# Patient Record
Sex: Female | Born: 1945 | Race: White | Hispanic: No | Marital: Married | Smoking: Never smoker
Health system: Southern US, Community
[De-identification: ages and names within clinical notes are randomized; demographics above are authoritative.]

## PROBLEM LIST (undated history)

## (undated) DIAGNOSIS — F329 Major depressive disorder, single episode, unspecified: Secondary | ICD-10-CM

## (undated) DIAGNOSIS — H409 Unspecified glaucoma: Secondary | ICD-10-CM

## (undated) DIAGNOSIS — E559 Vitamin D deficiency, unspecified: Secondary | ICD-10-CM

## (undated) DIAGNOSIS — I428 Other cardiomyopathies: Secondary | ICD-10-CM

## (undated) DIAGNOSIS — M1990 Unspecified osteoarthritis, unspecified site: Secondary | ICD-10-CM

## (undated) DIAGNOSIS — G2581 Restless legs syndrome: Secondary | ICD-10-CM

## (undated) DIAGNOSIS — Z8679 Personal history of other diseases of the circulatory system: Secondary | ICD-10-CM

## (undated) DIAGNOSIS — Z8719 Personal history of other diseases of the digestive system: Secondary | ICD-10-CM

## (undated) DIAGNOSIS — E669 Obesity, unspecified: Secondary | ICD-10-CM

## (undated) DIAGNOSIS — F32A Depression, unspecified: Secondary | ICD-10-CM

## (undated) DIAGNOSIS — K219 Gastro-esophageal reflux disease without esophagitis: Secondary | ICD-10-CM

## (undated) DIAGNOSIS — Z5189 Encounter for other specified aftercare: Secondary | ICD-10-CM

## (undated) DIAGNOSIS — I509 Heart failure, unspecified: Secondary | ICD-10-CM

## (undated) DIAGNOSIS — I10 Essential (primary) hypertension: Secondary | ICD-10-CM

## (undated) DIAGNOSIS — F419 Anxiety disorder, unspecified: Secondary | ICD-10-CM

## (undated) HISTORY — DX: Unspecified osteoarthritis, unspecified site: M19.90

## (undated) HISTORY — PX: FOOT SURGERY: SHX648

## (undated) HISTORY — DX: Essential (primary) hypertension: I10

## (undated) HISTORY — PX: JOINT REPLACEMENT: SHX530

## (undated) HISTORY — DX: Obesity, unspecified: E66.9

## (undated) HISTORY — PX: TOE SURGERY: SHX1073

## (undated) HISTORY — DX: Heart failure, unspecified: I50.9

## (undated) HISTORY — DX: Encounter for other specified aftercare: Z51.89

---

## 1978-09-25 HISTORY — PX: BLADDER SUSPENSION: SHX72

## 1988-09-25 HISTORY — PX: ABDOMINAL HYSTERECTOMY: SHX81

## 2010-10-26 HISTORY — PX: CARDIAC CATHETERIZATION: SHX172

## 2010-10-26 HISTORY — PX: OTHER SURGICAL HISTORY: SHX169

## 2013-01-22 ENCOUNTER — Ambulatory Visit (INDEPENDENT_AMBULATORY_CARE_PROVIDER_SITE_OTHER): Payer: Medicare Other | Admitting: Surgery

## 2013-01-23 ENCOUNTER — Encounter (INDEPENDENT_AMBULATORY_CARE_PROVIDER_SITE_OTHER): Payer: Self-pay

## 2013-01-23 ENCOUNTER — Ambulatory Visit (INDEPENDENT_AMBULATORY_CARE_PROVIDER_SITE_OTHER): Payer: Medicare Other | Admitting: General Surgery

## 2013-01-23 ENCOUNTER — Encounter (INDEPENDENT_AMBULATORY_CARE_PROVIDER_SITE_OTHER): Payer: Self-pay | Admitting: General Surgery

## 2013-01-23 DIAGNOSIS — M129 Arthropathy, unspecified: Secondary | ICD-10-CM

## 2013-01-23 DIAGNOSIS — M1990 Unspecified osteoarthritis, unspecified site: Secondary | ICD-10-CM

## 2013-01-23 DIAGNOSIS — F32A Depression, unspecified: Secondary | ICD-10-CM

## 2013-01-23 DIAGNOSIS — I509 Heart failure, unspecified: Secondary | ICD-10-CM

## 2013-01-23 DIAGNOSIS — F329 Major depressive disorder, single episode, unspecified: Secondary | ICD-10-CM

## 2013-01-23 DIAGNOSIS — Z6841 Body Mass Index (BMI) 40.0 and over, adult: Secondary | ICD-10-CM

## 2013-01-23 DIAGNOSIS — I10 Essential (primary) hypertension: Secondary | ICD-10-CM

## 2013-01-23 NOTE — Progress Notes (Signed)
Patient ID: Marie Martinez, female   DOB: 12-21-1945, 67 y.o.   MRN: [id number]  Chief Complaint  Patient presents with  . Bariatric Pre-op    initial    HPI Marie Martinez is a 67 y.o. female.  This patient presents for her initial weight loss surgery evaluation. She has a BMI of 47.6 with comorbidities of depression, arthritis, hypertension, and congestive heart failure.  She has a tender information session and is most interested in a vertical sleeve gastrectomy. She has struggled with her weight since delayed-release attributes this to some of the medication that she was placed on for treatment of arthritis and extremity pains. Since then she is off her chronic pain medication but still has issues with severe arthritis and is limited in her activity. She says that she can walk about 2 city blocks before she will get short of breath. About 3 years ago she was admitted to the hospital for treatment of CHF and her doctor is Dr. Karren Burly. She is also followed by a psychologist, Dr Milly Jakob.  She has tried several diets including Weight Watchers and Nutrisystem in the Nutrisystem seem to work the best where she lost about 40 pounds in 6 months but regained the weight. She denies any history of heartburn or reflux. She denies any active chest pain and says that she had a normal cardiac catheter about 3 years ago. HPI  Past Medical History  Diagnosis Date  . Arthritis   . Blood transfusion without reported diagnosis   . CHF (congestive heart failure)   . Hypertension     Past Surgical History  Procedure Laterality Date  . Abdominal hysterectomy  1990    bladder tack  . Foot surgery Bilateral late 80's  . Heart ablation  2012    Family History  Problem Relation Age of Onset  . Cancer Mother     breast  . Dementia Mother   . Heart disease Father   . Cancer Maternal Aunt     breast  . Cancer Cousin     breast    Social History History  Substance Use Topics  . Smoking status:  Never Smoker   . Smokeless tobacco: Not on file  . Alcohol Use: Yes     Comment: wine socially    Allergies  Allergen Reactions  . Eggs Or Egg-Derived Products Nausea And Vomiting  . Sulfur Nausea Only    Current Outpatient Prescriptions  Medication Sig Dispense Refill  . busPIRone (BUSPAR) 15 MG tablet       . carvedilol (COREG) 12.5 MG tablet       . CELEBREX 200 MG capsule       . DULoxetine (CYMBALTA) 60 MG capsule       . furosemide (LASIX) 20 MG tablet Take 20 mg by mouth as needed.      . gabapentin (NEURONTIN) 600 MG tablet       . HYDROcodone-acetaminophen (NORCO) 7.5-325 MG per tablet       . hydroxychloroquine (PLAQUENIL) 200 MG tablet       . latanoprost (XALATAN) 0.005 % ophthalmic solution       . lisinopril (PRINIVIL,ZESTRIL) 10 MG tablet       . nystatin-triamcinolone ointment (MYCOLOG)       . Oxcarbazepine (TRILEPTAL) 300 MG tablet       . rOPINIRole (REQUIP) 2 MG tablet       . Urea 40 % LOTN        No current  facility-administered medications for this visit.    Review of Systems Review of Systems All other review of systems negative or noncontributory except as stated in the HPI  Blood pressure 102/60, pulse 80, temperature 97.4 F (36.3 C), temperature source Temporal, resp. rate 18, height 5' 5.25" (1.657 m), weight 288 lb 9.6 oz (130.908 kg), SpO2 98.00%.  Physical Exam Physical Exam Physical Exam  Nursing note and vitals reviewed. Constitutional: She is oriented to person, place, and time. She appears well-developed and well-nourished. No distress.  HENT:  Head: Normocephalic and atraumatic.  Mouth/Throat: No oropharyngeal exudate.  Eyes: Conjunctivae and EOM are normal. Pupils are equal, round, and reactive to light. Right eye exhibits no discharge. Left eye exhibits no discharge. No scleral icterus.  Neck: Normal range of motion. Neck supple. No tracheal deviation present.  Cardiovascular: Normal rate, regular rhythm, normal heart sounds and  intact distal pulses.   Pulmonary/Chest: Effort normal and breath sounds normal. No stridor. No respiratory distress. She has no wheezes.  Abdominal: Soft. Bowel sounds are normal. She exhibits no distension and no mass. There is no tenderness. There is no rebound and no guarding.  Musculoskeletal: Normal range of motion. She exhibits no edema and no tenderness.  Neurological: She is alert and oriented to person, place, and time.  Skin: Skin is warm and dry. No rash noted. She is not diaphoretic. No erythema. No pallor.  Psychiatric: She has a normal mood and affect. Her behavior is normal. Judgment and thought content normal.     Data Reviewed   Assessment    Morbid obesity with a BMI of 47.7 and comorbidities of depression, arthritis, hypertension, and congestive heart failure With a long discussion about the medical and surgical options for weight loss and we discussed the pros and cons of each procedure. I think that she would qualify of 4 any of the surgeries that we offered, however, my main concern is not really what procedure to choose for but whether we should operate at all for weight loss. Though I think that she would certainly benefit from the weight loss, I'm not sure that we were really extend her life at this age. She would certainly be higher risk for cardiac complications given her history of CHF. I would certainly want her cardiologist on board with recommendations. I think the main benefit for surgery for her would be quality-of-life if we can achieve the weight loss that she desires. We did discuss the risks and benefits of each procedure including the risks of sleeve gastrectomy.  The risks of infection, bleeding, pain, scarring, weight regain, too little or too much weight loss, vitamin deficiencies and need for lifelong vitamin supplementation, hair loss, need for protein supplementation, leaks, stricture, reflux, food intolerance, need for reoperation and conversion to roux Y  gastric bypass, need for open surgery, injury to spleen or surrounding structures, DVT's, PE, and death again discussed with the patient and the patient expressed understanding and desires to proceed with laparoscopic vertical sleeve gastrectomy, possible open, intraoperative endoscopy. She would like to proceed with a sleeve gastrectomy. I recommended that she discussed this with her cardiologist and think about whether she really wants to undergo the risk of surgery and whether she thinks that it really would improve her quality of life enough to make it worthwhile for her.     Plan    We will go ahead and request records and clearance from her cardiologist and check some laboratory studies and preoperative nutrition and Psychology evaluations,  and set her up for upper GI. We will go ahead and see her back after this to discuss the results and whether she wants to continue with sleeve gastrectomy        Bradd Merlos DAVID 01/23/2013, 5:15 PM

## 2013-01-24 ENCOUNTER — Telehealth (INDEPENDENT_AMBULATORY_CARE_PROVIDER_SITE_OTHER): Payer: Self-pay

## 2013-01-24 NOTE — Telephone Encounter (Signed)
Pre-Op Cardiac Clearance faxed to Dr. Conrad Labette @ [facility] [phone]. Fax confirmation rec'd and attached to clearance

## 2013-02-03 ENCOUNTER — Ambulatory Visit (HOSPITAL_COMMUNITY)
Admission: RE | Admit: 2013-02-03 | Discharge: 2013-02-03 | Disposition: A | Discharge status: Home / Self Care | Payer: Medicare Other | Source: Ambulatory Visit | Attending: General Surgery | Admitting: General Surgery

## 2013-02-03 DIAGNOSIS — M129 Arthropathy, unspecified: Secondary | ICD-10-CM | POA: Insufficient documentation

## 2013-02-03 DIAGNOSIS — I509 Heart failure, unspecified: Secondary | ICD-10-CM

## 2013-02-03 DIAGNOSIS — I10 Essential (primary) hypertension: Secondary | ICD-10-CM

## 2013-02-03 DIAGNOSIS — F329 Major depressive disorder, single episode, unspecified: Secondary | ICD-10-CM | POA: Insufficient documentation

## 2013-02-03 DIAGNOSIS — F3289 Other specified depressive episodes: Secondary | ICD-10-CM | POA: Insufficient documentation

## 2013-02-03 DIAGNOSIS — Z6841 Body Mass Index (BMI) 40.0 and over, adult: Secondary | ICD-10-CM | POA: Insufficient documentation

## 2013-02-03 DIAGNOSIS — K449 Diaphragmatic hernia without obstruction or gangrene: Secondary | ICD-10-CM | POA: Insufficient documentation

## 2013-02-03 DIAGNOSIS — M1990 Unspecified osteoarthritis, unspecified site: Secondary | ICD-10-CM

## 2013-02-03 DIAGNOSIS — F32A Depression, unspecified: Secondary | ICD-10-CM

## 2013-02-03 LAB — COMPREHENSIVE METABOLIC PANEL
ALT: 12 U/L (ref 0–35)
Albumin: 4 g/dL (ref 3.5–5.2)
Alkaline Phosphatase: 76 U/L (ref 39–117)
CO2: 27 mEq/L (ref 19–32)
Glucose, Bld: 86 mg/dL (ref 70–99)
Potassium: 4.8 mEq/L (ref 3.5–5.3)
Sodium: 135 mEq/L (ref 135–145)
Total Protein: 6.7 g/dL (ref 6.0–8.3)

## 2013-02-03 LAB — CBC WITH DIFFERENTIAL/PLATELET
Basophils Relative: 1 % (ref 0–1)
Hemoglobin: 12.1 g/dL (ref 12.0–15.0)
Lymphs Abs: 1.2 10*3/uL (ref 0.7–4.0)
Monocytes Relative: 12 % (ref 3–12)
Neutro Abs: 2.8 10*3/uL (ref 1.7–7.7)
Neutrophils Relative %: 60 % (ref 43–77)
RBC: 4.34 MIL/uL (ref 3.87–5.11)

## 2013-02-03 LAB — LIPID PANEL
HDL: 54 mg/dL (ref >39)
LDL Cholesterol: 111 mg/dL — ABNORMAL HIGH (ref 0–99)
VLDL: 18 mg/dL (ref 0–40)

## 2013-02-03 LAB — T4: T4, Total: 6.8 ug/dL (ref 5.0–12.5)

## 2013-02-04 ENCOUNTER — Ambulatory Visit (INDEPENDENT_AMBULATORY_CARE_PROVIDER_SITE_OTHER): Payer: 59 | Admitting: Psychiatry

## 2013-02-11 ENCOUNTER — Ambulatory Visit: Payer: 59 | Admitting: Psychiatry

## 2013-02-12 ENCOUNTER — Ambulatory Visit: Payer: Self-pay | Admitting: *Deleted

## 2013-02-20 ENCOUNTER — Ambulatory Visit: Payer: Self-pay | Admitting: *Deleted

## 2013-05-14 ENCOUNTER — Ambulatory Visit: Payer: 59 | Admitting: Psychiatry

## 2013-05-27 ENCOUNTER — Ambulatory Visit (INDEPENDENT_AMBULATORY_CARE_PROVIDER_SITE_OTHER): Payer: 59 | Admitting: Psychiatry

## 2013-06-24 ENCOUNTER — Encounter (INDEPENDENT_AMBULATORY_CARE_PROVIDER_SITE_OTHER): Payer: Self-pay

## 2013-10-15 ENCOUNTER — Telehealth (INDEPENDENT_AMBULATORY_CARE_PROVIDER_SITE_OTHER): Payer: Self-pay | Admitting: Surgery

## 2013-10-15 ENCOUNTER — Encounter (INDEPENDENT_AMBULATORY_CARE_PROVIDER_SITE_OTHER): Payer: Self-pay

## 2013-10-15 ENCOUNTER — Encounter (INDEPENDENT_AMBULATORY_CARE_PROVIDER_SITE_OTHER): Payer: Self-pay | Admitting: Surgery

## 2013-10-15 ENCOUNTER — Ambulatory Visit (INDEPENDENT_AMBULATORY_CARE_PROVIDER_SITE_OTHER): Payer: Medicare PPO | Admitting: Surgery

## 2013-10-15 DIAGNOSIS — Z6841 Body Mass Index (BMI) 40.0 and over, adult: Secondary | ICD-10-CM

## 2013-10-15 DIAGNOSIS — I10 Essential (primary) hypertension: Secondary | ICD-10-CM

## 2013-10-15 NOTE — Patient Instructions (Signed)

## 2013-10-15 NOTE — Progress Notes (Signed)
Patient ID: Marie Martinez, female   DOB: 1945-12-03, 68 y.o.   MRN: [id number]  Chief Complaint  Patient presents with  . Obesity    transfer of care from Dr. Bjorn Pippin    HPI Marie Martinez is a 68 y.o. female.  This patient presents for her initial weight loss surgery evaluation. She has a BMI of 47.6 with comorbidities of depression, arthritis, hypertension, and congestive heart failure.  She has attended an  information session and is most interested in a vertical sleeve gastrectomy.  She has done the 6 months supervised weight loss.  She has struggled with her weight since delayed-release attributes this to some of the medication that she was placed on for treatment of arthritis and extremity pains. Since then she is off her chronic pain medication but still has issues with severe arthritis and is limited in her activity. She says that she can walk about 2 city blocks before she will get short of breath. About 3 years ago she was admitted to the hospital for treatment of CHF and her doctor is Dr. Karren Burly. She is also followed by a psychologist, Dr Milly Jakob.  She has tried several diets including Weight Watchers and Nutrisystem in the Nutrisystem seem to work the best where she lost about 40 pounds in 6 months but regained the weight. She denies any history of heartburn or reflux. She denies any active chest pain and says that she had a normal cardiac catheter about 3 years ago.  She is now eager to schedule a sleeve gastrectomy HPI  Past Medical History  Diagnosis Date  . Arthritis   . Blood transfusion without reported diagnosis   . CHF (congestive heart failure)   . Hypertension   . Osteoarthritis     Past Surgical History  Procedure Laterality Date  . Abdominal hysterectomy  1990    bladder tack  . Foot surgery Bilateral late 80's  . Heart ablation  2012  . Bladder suspension    . Toe surgery      Family History  Problem Relation Age of Onset  . Cancer Mother     breast  .  Dementia Mother   . Heart disease Father   . Cancer Maternal Aunt     breast  . Cancer Cousin     breast    Social History History  Substance Use Topics  . Smoking status: Never Smoker   . Smokeless tobacco: Not on file  . Alcohol Use: Yes     Comment: wine socially    Allergies  Allergen Reactions  . Eggs Or Egg-Derived Products Nausea And Vomiting  . Sulfur Nausea Only    Current Outpatient Prescriptions  Medication Sig Dispense Refill  . busPIRone (BUSPAR) 15 MG tablet       . carvedilol (COREG) 12.5 MG tablet       . CELEBREX 200 MG capsule       . DULoxetine (CYMBALTA) 60 MG capsule       . furosemide (LASIX) 20 MG tablet Take 20 mg by mouth as needed.      . gabapentin (NEURONTIN) 600 MG tablet       . HYDROcodone-acetaminophen (NORCO) 7.5-325 MG per tablet       . hydroxychloroquine (PLAQUENIL) 200 MG tablet       . hydrOXYzine (ATARAX/VISTARIL) 25 MG tablet       . latanoprost (XALATAN) 0.005 % ophthalmic solution       . levofloxacin (LEVAQUIN) 500 MG tablet       .  lisinopril (PRINIVIL,ZESTRIL) 10 MG tablet       . nystatin-triamcinolone ointment (MYCOLOG)       . Oxcarbazepine (TRILEPTAL) 300 MG tablet       . oxycodone (OXY-IR) 5 MG capsule Take 5 mg by mouth every 4 (four) hours as needed.      Marland Kitchen. rOPINIRole (REQUIP) 2 MG tablet       . Urea 40 % LOTN        No current facility-administered medications for this visit.    Review of Systems Review of Systems All other review of systems negative or noncontributory except as stated in the HPI  Blood pressure 146/86, pulse 72, temperature 98.2 F (36.8 C), temperature source Temporal, resp. rate 24, height 5\' 6"  (1.676 m), weight 292 lb (132.45 kg).  Physical Exam Physical Exam Physical Exam  Nursing note and vitals reviewed. Constitutional: She is oriented to person, place, and time. She appears well-developed and well-nourished. No distress.  HENT:  Head: Normocephalic and atraumatic.   Mouth/Throat: No oropharyngeal exudate.  Eyes: Conjunctivae and EOM are normal. Pupils are equal, round, and reactive to light. Right eye exhibits no discharge. Left eye exhibits no discharge. No scleral icterus.  Neck: Normal range of motion. Neck supple. No tracheal deviation present.  Cardiovascular: Normal rate, regular rhythm, normal heart sounds and intact distal pulses.   Pulmonary/Chest: Effort normal and breath sounds normal. No stridor. No respiratory distress. She has no wheezes.  Abdominal: Soft. Bowel sounds are normal. She exhibits no distension and no mass. There is no tenderness. There is no rebound and no guarding.  Musculoskeletal: Normal range of motion. She exhibits no edema and no tenderness.  Neurological: She is alert and oriented to person, place, and time.  Skin: Skin is warm and dry. No rash noted. She is not diaphoretic. No erythema. No pallor.  Psychiatric: She has a normal mood and affect. Her behavior is normal. Judgment and thought content normal.     Data Reviewed   Assessment    Morbid obesity with a BMI of 47.7 and comorbidities of depression, arthritis, hypertension, and congestive heart failure she has had thorough discussions of the risks of surgery and the benefits. Her main issue has been her arthritis and joint pain. She wants to go ahead and proceed with sleeve gastrectomy. She has the approval of her cardiologist. she has an appointment to visit with the dietitians in a few weeks.  Plan    Laparoscopic sleeve gastrectomy   Ashliegh Parekh B 10/15/2013, 12:36 PM

## 2013-11-01 ENCOUNTER — Encounter: Payer: Medicare HMO | Attending: Unknown Physician Specialty | Admitting: Dietician

## 2013-11-01 DIAGNOSIS — Z713 Dietary counseling and surveillance: Secondary | ICD-10-CM | POA: Insufficient documentation

## 2013-11-01 DIAGNOSIS — Z01818 Encounter for other preprocedural examination: Secondary | ICD-10-CM | POA: Insufficient documentation

## 2013-11-05 ENCOUNTER — Encounter: Payer: Medicare HMO | Admitting: Dietician

## 2013-11-05 ENCOUNTER — Encounter: Payer: Self-pay | Admitting: Dietician

## 2013-11-05 NOTE — Patient Instructions (Signed)
Patient to contact Skip EstimableLaurie Deaton.

## 2013-11-05 NOTE — Progress Notes (Signed)
Pre-Operative Nutrition Education:  Appt start time: 0300   End time:  0400.  Patient was seen on 11/05/2013 for Pre-Operative Bariatric Surgery Education at [facility]. This patient was unable to enroll in pre-op class and received the nutrition portion of the pre-op class with me today. She was not aware of appropriate protein shakes and was unaware that she is required to take supplements. I encouraged her to contact me with any follow-up questions and recommended that she contact Vilinda Flake for the remainder of the pre-op class information.  Surgery date: 11/17/2013 Surgery type: Gastric sleeve Start weight at [facility]: 290.5 lbs Weight today: 290.5 lbs  TANITA  BODY COMP RESULTS  11/05/2013   BMI (kg/m^2) 46.9   Fat Mass (lbs) 146.5   Fat Free Mass (lbs) 144   Total Body Water (lbs) 105.5    The following the learning objectives were met by the patient during this course:  Identify Pre-Op Dietary Goals and will begin 2 weeks pre-operatively  Identify appropriate sources of fluids and proteins   State protein recommendations and appropriate sources pre and post-operatively  Identify Post-Operative Dietary Goals and will follow for 2 weeks post-operatively  Identify appropriate multivitamin and calcium sources  Describe the need for physical activity post-operatively and will follow MD recommendations  State when to call healthcare provider regarding medication questions or post-operative complications  Handouts given:  Pre-Op Bariatric Surgery Diet Handout  Protein Shake Handout  Post-Op Bariatric Surgery Nutrition Handout  Follow-Up Plan: Patient will follow-up at [facility] 2 weeks post operatively for diet advancement per MD. Patient to contact Vilinda Flake for remainding portion of pre-op class.

## 2013-11-10 ENCOUNTER — Encounter (HOSPITAL_COMMUNITY): Payer: Self-pay | Admitting: Pharmacy Technician

## 2013-11-10 NOTE — Progress Notes (Signed)
Please put orders in Epic surgery 11-17-13, pre op 11-13-13 Thank you!

## 2013-11-11 NOTE — Progress Notes (Signed)
Please put orders in Epic surgery 11-17-13 pre op 11-13-13 Thank you! 

## 2013-11-11 NOTE — Progress Notes (Signed)
Please put orders in Epic surgery 11-17-13 pre op 11-13-13 Thank you!

## 2013-11-12 NOTE — Progress Notes (Signed)
Please put orders in Epic surgery 11-17-13, pre op visit tomorrow 11-13-13 Thank you!

## 2013-11-12 NOTE — Progress Notes (Signed)
EKG 3/219/14 - ECHO 12/16/12 - [facility] - ON CHART

## 2013-11-13 ENCOUNTER — Encounter (INDEPENDENT_AMBULATORY_CARE_PROVIDER_SITE_OTHER): Payer: Self-pay | Admitting: Surgery

## 2013-11-13 ENCOUNTER — Encounter (HOSPITAL_COMMUNITY)
Admission: RE | Admit: 2013-11-13 | Discharge: 2013-11-13 | Disposition: A | Discharge status: Home / Self Care | Payer: Medicare PPO | Source: Ambulatory Visit | Attending: Surgery | Admitting: Surgery

## 2013-11-13 ENCOUNTER — Other Ambulatory Visit (INDEPENDENT_AMBULATORY_CARE_PROVIDER_SITE_OTHER): Payer: Self-pay | Admitting: Surgery

## 2013-11-13 ENCOUNTER — Ambulatory Visit (INDEPENDENT_AMBULATORY_CARE_PROVIDER_SITE_OTHER): Payer: Medicare PPO | Admitting: Surgery

## 2013-11-13 ENCOUNTER — Ambulatory Visit (HOSPITAL_COMMUNITY)
Admission: RE | Admit: 2013-11-13 | Discharge: 2013-11-13 | Disposition: A | Discharge status: Home / Self Care | Payer: Medicare PPO | Source: Ambulatory Visit | Attending: Surgery | Admitting: Surgery

## 2013-11-13 ENCOUNTER — Encounter (HOSPITAL_COMMUNITY): Payer: Self-pay

## 2013-11-13 DIAGNOSIS — Z01818 Encounter for other preprocedural examination: Secondary | ICD-10-CM | POA: Insufficient documentation

## 2013-11-13 DIAGNOSIS — I10 Essential (primary) hypertension: Secondary | ICD-10-CM | POA: Insufficient documentation

## 2013-11-13 DIAGNOSIS — F3289 Other specified depressive episodes: Secondary | ICD-10-CM

## 2013-11-13 DIAGNOSIS — M129 Arthropathy, unspecified: Secondary | ICD-10-CM

## 2013-11-13 DIAGNOSIS — Z01812 Encounter for preprocedural laboratory examination: Secondary | ICD-10-CM | POA: Insufficient documentation

## 2013-11-13 DIAGNOSIS — M1990 Unspecified osteoarthritis, unspecified site: Secondary | ICD-10-CM | POA: Insufficient documentation

## 2013-11-13 DIAGNOSIS — F329 Major depressive disorder, single episode, unspecified: Secondary | ICD-10-CM

## 2013-11-13 DIAGNOSIS — F32A Depression, unspecified: Secondary | ICD-10-CM | POA: Insufficient documentation

## 2013-11-13 HISTORY — DX: Unspecified glaucoma: H40.9

## 2013-11-13 HISTORY — DX: Personal history of other diseases of the digestive system: Z87.19

## 2013-11-13 HISTORY — DX: Personal history of other diseases of the circulatory system: Z86.79

## 2013-11-13 HISTORY — DX: Major depressive disorder, single episode, unspecified: F32.9

## 2013-11-13 HISTORY — DX: Gastro-esophageal reflux disease without esophagitis: K21.9

## 2013-11-13 HISTORY — DX: Restless legs syndrome: G25.81

## 2013-11-13 HISTORY — DX: Other cardiomyopathies: I42.8

## 2013-11-13 HISTORY — DX: Depression, unspecified: F32.A

## 2013-11-13 HISTORY — DX: Vitamin D deficiency, unspecified: E55.9

## 2013-11-13 HISTORY — DX: Anxiety disorder, unspecified: F41.9

## 2013-11-13 LAB — COMPREHENSIVE METABOLIC PANEL
ALK PHOS: 93 U/L (ref 39–117)
ALT: 10 U/L (ref 0–35)
AST: 15 U/L (ref 0–37)
Albumin: 4.3 g/dL (ref 3.5–5.2)
BUN: 27 mg/dL — AB (ref 6–23)
CALCIUM: 10 mg/dL (ref 8.4–10.5)
CO2: 26 meq/L (ref 19–32)
Chloride: 91 mEq/L — ABNORMAL LOW (ref 96–112)
Creatinine, Ser: 0.88 mg/dL (ref 0.50–1.10)
GFR calc non Af Amer: 66 mL/min — ABNORMAL LOW (ref >90)
GFR, EST AFRICAN AMERICAN: 77 mL/min — AB (ref >90)
GLUCOSE: 92 mg/dL (ref 70–99)
Potassium: 5.2 mEq/L (ref 3.7–5.3)
Sodium: 129 mEq/L — ABNORMAL LOW (ref 137–147)
TOTAL PROTEIN: 7.8 g/dL (ref 6.0–8.3)
Total Bilirubin: 0.3 mg/dL (ref 0.3–1.2)

## 2013-11-13 LAB — CBC WITH DIFFERENTIAL/PLATELET
Basophils Absolute: 0 10*3/uL (ref 0.0–0.1)
Basophils Relative: 1 % (ref 0–1)
Eosinophils Absolute: 0 10*3/uL (ref 0.0–0.7)
Eosinophils Relative: 0 % (ref 0–5)
HEMATOCRIT: 41.1 % (ref 36.0–46.0)
Hemoglobin: 14.3 g/dL (ref 12.0–15.0)
LYMPHS ABS: 1.4 10*3/uL (ref 0.7–4.0)
LYMPHS PCT: 21 % (ref 12–46)
MCH: 28.2 pg (ref 26.0–34.0)
MCHC: 34.8 g/dL (ref 30.0–36.0)
MCV: 81.1 fL (ref 78.0–100.0)
Monocytes Absolute: 0.7 10*3/uL (ref 0.1–1.0)
Monocytes Relative: 11 % (ref 3–12)
Neutro Abs: 4.7 10*3/uL (ref 1.7–7.7)
Neutrophils Relative %: 68 % (ref 43–77)
PLATELETS: 352 10*3/uL (ref 150–400)
RBC: 5.07 MIL/uL (ref 3.87–5.11)
RDW: 13 % (ref 11.5–15.5)
WBC: 6.9 10*3/uL (ref 4.0–10.5)

## 2013-11-13 NOTE — Progress Notes (Signed)
Patient ID: Marie Martinez, female   DOB: Feb 02, 1946, 68 y.o.   MRN: [id number]  Chief Complaint  Patient presents with  . Bariatric Pre-op    Pre-Op Sleeve    HPI Marie Martinez is a 68 y.o. female.  This patient presents for her initial weight loss surgery evaluation. She has a BMI of 47.6 with comorbidities of depression, arthritis, hypertension, and congestive heart failure.  She has attended an  information session and is most interested in a vertical sleeve gastrectomy.  She has done the 6 months supervised weight loss.  She has struggled with her weight since delayed-release attributes this to some of the medication that she was placed on for treatment of arthritis and extremity pains. Since then she is off her chronic pain medication but still has issues with severe arthritis and is limited in her activity. She says that she can walk about 2 city blocks before she will get short of breath. About 3 years ago she was admitted to the hospital for treatment of CHF and her doctor is Dr. Karren Burly. She is also followed by a psychologist, Dr Milly Jakob.  She has tried several diets including Weight Watchers and Nutrisystem in the Nutrisystem seem to work the best where she lost about 40 pounds in 6 months but regained the weight. She denies any history of heartburn or reflux. She denies any active chest pain and says that she had a normal cardiac catheter about 3 years ago.  She is now eager to schedule a sleeve gastrectomy HPI  Past Medical History  Diagnosis Date  . Arthritis   . Blood transfusion without reported diagnosis   . CHF (congestive heart failure)   . Hypertension   . Osteoarthritis   . Obesity     Past Surgical History  Procedure Laterality Date  . Abdominal hysterectomy  1990    bladder tack  . Foot surgery Bilateral late 80's  . Heart ablation  2012  . Bladder suspension    . Toe surgery      Family History  Problem Relation Age of Onset  . Cancer Mother      breast  . Dementia Mother   . Heart disease Father   . Cancer Maternal Aunt     breast  . Cancer Cousin     breast    Social History History  Substance Use Topics  . Smoking status: Never Smoker   . Smokeless tobacco: Not on file  . Alcohol Use: Yes     Comment: wine socially    Allergies  Allergen Reactions  . Eggs Or Egg-Derived Products Nausea And Vomiting  . Sulfur Nausea Only    Current Outpatient Prescriptions  Medication Sig Dispense Refill  . buPROPion (WELLBUTRIN XL) 150 MG 24 hr tablet Take 300 mg by mouth every morning.      . busPIRone (BUSPAR) 15 MG tablet Take 30 mg by mouth 2 (two) times daily.       . carvedilol (COREG) 12.5 MG tablet Take 12.5 mg by mouth 2 (two) times daily with a meal.       . CELEBREX 200 MG capsule Take 200 mg by mouth 2 (two) times daily.       . DULoxetine (CYMBALTA) 60 MG capsule Take 120 mg by mouth every morning.       . furosemide (LASIX) 20 MG tablet Take 20 mg by mouth daily as needed for fluid.       Marland Kitchen gabapentin (NEURONTIN) 600 MG  tablet Take 1,800 mg by mouth at bedtime.       . hydroxychloroquine (PLAQUENIL) 200 MG tablet Take 200 mg by mouth 2 (two) times daily.       Marland Kitchen. latanoprost (XALATAN) 0.005 % ophthalmic solution Place 1 drop into both eyes at bedtime.       . Melatonin 10 MG TABS Take 10 mg by mouth at bedtime.      . Oxcarbazepine (TRILEPTAL) 300 MG tablet Take 300 mg by mouth at bedtime.       Marland Kitchen. oxycodone (OXY-IR) 5 MG capsule Take 5 mg by mouth every 4 (four) hours as needed for pain.       Marland Kitchen. rOPINIRole (REQUIP) 2 MG tablet Take 2 mg by mouth at bedtime.       Marland Kitchen. tiZANidine (ZANAFLEX) 4 MG tablet        No current facility-administered medications for this visit.    Review of Systems Review of Systems All other review of systems negative or noncontributory except as stated in the HPI  Blood pressure 118/84, pulse 71, temperature 97.8 F (36.6 C), temperature source Temporal, resp. rate 18, height 5\' 6"  (1.676  m), weight 285 lb (129.275 kg).  Physical Exam Physical Exam Physical Exam  Nursing note and vitals reviewed. Constitutional: She is oriented to person, place, and time. She appears well-developed and well-nourished. No distress.  HENT:  Head: Normocephalic and atraumatic.  Mouth/Throat: No oropharyngeal exudate.  Eyes: Conjunctivae and EOM are normal. Pupils are equal, round, and reactive to light. Right eye exhibits no discharge. Left eye exhibits no discharge. No scleral icterus.  Neck: Normal range of motion. Neck supple. No tracheal deviation present.  Cardiovascular: Normal rate, regular rhythm, normal heart sounds and intact distal pulses.   Pulmonary/Chest: Effort normal and breath sounds normal. No stridor. No respiratory distress. She has no wheezes.  Abdominal: Soft. Bowel sounds are normal. She exhibits no distension and no mass. There is no tenderness. There is no rebound and no guarding.  Musculoskeletal: Normal range of motion. She exhibits no edema and no tenderness.  Neurological: She is alert and oriented to person, place, and time.  Skin: Skin is warm and dry. No rash noted. She is not diaphoretic. No erythema. No pallor.  Psychiatric: She has a normal mood and affect. Her behavior is normal. Judgment and thought content normal.     Data Reviewed   Assessment    Morbid obesity with a BMI of 47.7 and comorbidities of depression, arthritis, hypertension, and congestive heart failure she has had thorough discussions of the risks of surgery and the benefits. Her main issue has been her arthritis and joint pain. She wants to go ahead and proceed with sleeve gastrectomy. She has the approval of her cardiologist. she has an appointment to visit with the dietitians in a few weeks.  She has completed the six month physician supervised weight loss.  UGI showed a small sliding hiatus hernia.    Plan    Laparoscopic sleeve gastrectomy   Marian Grandt B 11/13/2013, 10:33  AM

## 2013-11-13 NOTE — Patient Instructions (Signed)
Sleeve Gastrectomy A sleeve gastrectomy is a surgery in which a large portion of the stomach is removed. After the surgery, the stomach will be a narrow tube about the size of a banana. This surgery is performed to help a person lose weight. The person loses weight because the reduced size of the stomach restricts the amount of food that the person can eat. The stomach will hold much less food than before the surgery. Also, the part of the stomach that is removed produces a hormone that causes hunger.  This surgery is done for people who have morbid obesity, defined as a body mass index (BMI) greater than 40. BMI is an estimate of body fat and is calculated from the height and weight of a person. This surgery may also be done for people with a BMI between 35 and 40 if they have other diseases, such as type 2 diabetes mellitus, obstructive sleep apnea, or heart and lung disorders (cardiopulmonary diseases).  LET YOUR HEALTH CARE PROVIDER KNOW ABOUT:  Any allergies you have.   All medicines you are taking, including vitamins, herbs, eyedrops, creams, and over-the-counter medicines.   Use of steroids (by mouth or creams).   Previous problems you or members of your family have had with the use of anesthetics.   Any blood disorders you have.   Previous surgeries you have had.   Possibility of pregnancy, if this applies.   Other health problems you have. RISKS AND COMPLICATIONS Generally, sleeve gastrectomy is a safe procedure. However, as with any procedure, complications can occur. Possible complications include:  Infection.  Bleeding.  Blood clots.  Damage to other organs or tissue.  Leakage of fluid from the stomach into the abdominal cavity (rare). BEFORE THE PROCEDURE  You may need to have blood tests and imaging tests (such as X-rays or ultrasonography) done before the day of surgery. A test to evaluate your esophagus and how it moves (esophageal manometry) may also be  done.  You may be placed on a liquid diet 2 3 weeks before the surgery.  Ask your health care provider about changing or stopping your regular medicines.  Do not eat or drink anything for at least 8 hours before the procedure.   Make plans to have someone drive you home after your hospital stay. Also arrange for someone to help you with activities during recovery. PROCEDURE  A laparoscopic technique is usually used for this surgery:  You will be given medicine to make you sleep through the procedure (general anesthetic). This medicine will be given through an intravenous (IV) access tube that is put into one of your veins.  Once you are asleep, your abdomen will be cleaned and sterilized.  Several small incisions will be made in your abdomen.  Your abdomen will be filled with air so that it expands. This gives the surgeon more room to operate and makes your organs easier to see.  A thin, lighted tube with a tiny camera on the end (laparoscope) is put through a small incision in your abdomen. The camera on the laparoscope sends a picture to a TV screen in the operating room. This gives the surgeon a good view inside the abdomen.  Hollow tubes are put through the other small incisions in your abdomen. The tools needed for the procedure are put through these tubes.  The surgeon uses staples to divide part of the stomach and then removes it through one of the incisions.  The remaining stomach may be reinforced using   stitches or surgical glue or both to prevent leakage of the stomach contents. A small tube (drain) may be placed through one of the incisions to allow extra fluid to flow from the area.  The incisions are closed with stitches, staples, or glue. AFTER THE PROCEDURE  You will be monitored closely in a recovery area. Once the anesthetic has worn off, you will likely be moved to a regular hospital room.  You will be given medicine for pain and nausea.   You may have a drain  from one of the incisions in your abdomen. If a drain is used, it may stay in place after you go home from the hospital and be removed at a follow-up appointment.   You will be encouraged to walk around several times a day. This helps prevent blood clots.  You will be started on a liquid diet the first day after your surgery. Sometimes a test is done to check for leaking before you can eat.  You will be urged to cough and do deep breathing exercises. This helps prevent a lung infection after a surgery.  You will likely need to stay in the hospital for a few days.  Document Released: 07/09/2009 Document Revised: 05/14/2013 Document Reviewed: 01/24/2013 Exit[employer] Patient Information 2014 [employer].  

## 2013-11-13 NOTE — Patient Instructions (Addendum)
20 Trey SailorsBetsy P Locust  11/13/2013   Your procedure is scheduled on: Monday February 23rd  Report to [facility] at  1045 AM.  Call this number if you have problems the morning of surgery [phone]   Remember: call dr Daphine Deutschermartin to see if need to stop plaquenil phone number [phone]  Do not eat food :After Midnight.   clear liquids midnight until 715 am day of surgery, then nothing by mouth.    Take these medicines the morning of surgery with A SIP OF WATER: cymbalta, carvedilol, oxycodone if needed, buspar                                SEE [facility] PREPARING FOR SURGERY SHEET             You may not have any metal on your body including hair pins and piercings  Do not wear jewelry, make-up.  Do not wear lotions, powders, or perfumes. No  Deodorant is to be worn.   Men may shave face and neck.  Do not bring valuables to the hospital. [facility] IS NOT RESPONSIBLE FOR VALUEABLES.  Contacts, dentures or bridgework may not be worn into surgery.  Leave suitcase in the car. After surgery it may be brought to your room.  For patients admitted to the hospital, checkout time is 11:00 AM the day of discharge.    Please read over the following fact sheets that you were given: [facility] Preparing [facility] sheet. Call Cain SieveSharon Aeriel Boulay RN pre op nurse if needed [phone]    FAILURE TO FOLLOW THESE INSTRUCTIONS MAY RESULT IN THE CANCELLATION OF YOUR SURGERY.  PATIENT SIGNATURE___________________________________________  NURSE SIGNATURE_____________________________________________

## 2013-11-13 NOTE — Progress Notes (Signed)
cmet results routed by epic to dr Javier Glaziermartin inbasket

## 2013-11-13 NOTE — Progress Notes (Addendum)
12-11-12 ekg [facility] on chart 12-16-12 echo [facility] on chart 09-15-13 lov note dr Karleen Hampshirespencer family md on chart Pt instructed to call dr Daphine Deutschermartin and find out if need to stop plaquenil prior to surgery.

## 2013-11-14 ENCOUNTER — Telehealth (INDEPENDENT_AMBULATORY_CARE_PROVIDER_SITE_OTHER): Payer: Self-pay | Admitting: *Deleted

## 2013-11-14 ENCOUNTER — Telehealth (INDEPENDENT_AMBULATORY_CARE_PROVIDER_SITE_OTHER): Payer: Self-pay

## 2013-11-14 NOTE — Telephone Encounter (Signed)
Called and left message for patient RE: patient does not need to hold Plaquenil prior to surgery per Dr. Daphine DeutscherMartin.

## 2013-11-14 NOTE — Telephone Encounter (Signed)
Patient called to make sure Dr. Daphine DeutscherMartin is aware that she is under contract [facility] so she is unable to receive any narcotics without them being aware.  Patient is scheduled for surgery on Monday.  Explained that I will send a message to make sure he is aware.  Patient states understanding at this time.

## 2013-11-14 NOTE — Telephone Encounter (Signed)
Dr. Daphine DeutscherMartin notified of patient pain management contract.

## 2013-11-17 ENCOUNTER — Inpatient Hospital Stay (HOSPITAL_COMMUNITY): Payer: Medicare PPO | Admitting: Certified Registered"

## 2013-11-17 ENCOUNTER — Encounter (HOSPITAL_COMMUNITY): Payer: Medicare PPO | Admitting: Certified Registered"

## 2013-11-17 ENCOUNTER — Inpatient Hospital Stay (HOSPITAL_COMMUNITY)
Admission: RE | Admit: 2013-11-17 | Discharge: 2013-11-19 | DRG: 621 | Disposition: A | Discharge status: Home / Self Care | Payer: Medicare PPO | Source: Ambulatory Visit | Attending: Surgery | Admitting: Surgery

## 2013-11-17 ENCOUNTER — Encounter (HOSPITAL_COMMUNITY): Payer: Self-pay | Admitting: *Deleted

## 2013-11-17 ENCOUNTER — Encounter (HOSPITAL_COMMUNITY)
Admission: RE | Disposition: A | Discharge status: Home / Self Care | Payer: Self-pay | Source: Ambulatory Visit | Attending: Surgery

## 2013-11-17 DIAGNOSIS — I10 Essential (primary) hypertension: Secondary | ICD-10-CM

## 2013-11-17 DIAGNOSIS — M1990 Unspecified osteoarthritis, unspecified site: Secondary | ICD-10-CM | POA: Diagnosis present

## 2013-11-17 DIAGNOSIS — I509 Heart failure, unspecified: Secondary | ICD-10-CM | POA: Diagnosis present

## 2013-11-17 DIAGNOSIS — M129 Arthropathy, unspecified: Secondary | ICD-10-CM

## 2013-11-17 DIAGNOSIS — K449 Diaphragmatic hernia without obstruction or gangrene: Secondary | ICD-10-CM | POA: Diagnosis present

## 2013-11-17 DIAGNOSIS — Z79899 Other long term (current) drug therapy: Secondary | ICD-10-CM

## 2013-11-17 DIAGNOSIS — Z6841 Body Mass Index (BMI) 40.0 and over, adult: Secondary | ICD-10-CM

## 2013-11-17 DIAGNOSIS — Z8249 Family history of ischemic heart disease and other diseases of the circulatory system: Secondary | ICD-10-CM

## 2013-11-17 DIAGNOSIS — Z9884 Bariatric surgery status: Secondary | ICD-10-CM

## 2013-11-17 DIAGNOSIS — F329 Major depressive disorder, single episode, unspecified: Secondary | ICD-10-CM | POA: Diagnosis present

## 2013-11-17 DIAGNOSIS — F3289 Other specified depressive episodes: Secondary | ICD-10-CM | POA: Diagnosis present

## 2013-11-17 HISTORY — PX: LAPAROSCOPIC GASTRIC SLEEVE RESECTION: SHX5895

## 2013-11-17 LAB — CBC
HCT: 37.1 % (ref 36.0–46.0)
HEMOGLOBIN: 12.3 g/dL (ref 12.0–15.0)
MCH: 26.9 pg (ref 26.0–34.0)
MCHC: 33.2 g/dL (ref 30.0–36.0)
MCV: 81.2 fL (ref 78.0–100.0)
PLATELETS: 303 10*3/uL (ref 150–400)
RBC: 4.57 MIL/uL (ref 3.87–5.11)
RDW: 12.9 % (ref 11.5–15.5)
WBC: 10.4 10*3/uL (ref 4.0–10.5)

## 2013-11-17 LAB — HEMOGLOBIN AND HEMATOCRIT, BLOOD
HEMATOCRIT: 36.5 % (ref 36.0–46.0)
HEMOGLOBIN: 12.2 g/dL (ref 12.0–15.0)

## 2013-11-17 LAB — GLUCOSE, CAPILLARY: GLUCOSE-CAPILLARY: 118 mg/dL — AB (ref 70–99)

## 2013-11-17 LAB — CREATININE, SERUM
Creatinine, Ser: 0.81 mg/dL (ref 0.50–1.10)
GFR calc Af Amer: 85 mL/min — ABNORMAL LOW (ref >90)
GFR, EST NON AFRICAN AMERICAN: 73 mL/min — AB (ref >90)

## 2013-11-17 SURGERY — GASTRECTOMY, SLEEVE, LAPAROSCOPIC
Anesthesia: General | Site: Abdomen

## 2013-11-17 MED ORDER — MIDAZOLAM HCL 5 MG/5ML IJ SOLN
INTRAMUSCULAR | Status: DC | PRN
  Administered 2013-11-17: 2 mg via INTRAVENOUS

## 2013-11-17 MED ORDER — PROMETHAZINE HCL 25 MG/ML IJ SOLN
6.2500 mg | INTRAMUSCULAR | Status: DC | PRN
Start: 2013-11-17 — End: 2013-11-17

## 2013-11-17 MED ORDER — NEOSTIGMINE METHYLSULFATE 1 MG/ML IJ SOLN
INTRAMUSCULAR | Status: DC | PRN
  Administered 2013-11-17: 4 mg via INTRAVENOUS

## 2013-11-17 MED ORDER — DEXAMETHASONE SODIUM PHOSPHATE 10 MG/ML IJ SOLN
INTRAMUSCULAR | Status: DC | PRN
  Administered 2013-11-17: 10 mg via INTRAVENOUS

## 2013-11-17 MED ORDER — PROPOFOL 10 MG/ML IV BOLUS
INTRAVENOUS | Status: DC | PRN
  Administered 2013-11-17: 150 mg via INTRAVENOUS

## 2013-11-17 MED ORDER — HYDROMORPHONE HCL PF 1 MG/ML IJ SOLN: 0.2500 mg | INTRAMUSCULAR | Status: DC | PRN

## 2013-11-17 MED ORDER — LACTATED RINGERS IR SOLN
Status: DC | PRN
  Administered 2013-11-17: 1

## 2013-11-17 MED ORDER — FENTANYL CITRATE 0.05 MG/ML IJ SOLN
INTRAMUSCULAR | Status: DC | PRN
  Administered 2013-11-17 (×3): 50 ug via INTRAVENOUS
  Administered 2013-11-17: 100 ug via INTRAVENOUS

## 2013-11-17 MED ORDER — ONDANSETRON HCL 4 MG/2ML IJ SOLN
4.0000 mg | INTRAMUSCULAR | Status: DC | PRN
  Administered 2013-11-17: 4 mg via INTRAVENOUS
  Filled 2013-11-17: qty 2

## 2013-11-17 MED ORDER — PHENYLEPHRINE 40 MCG/ML (10ML) SYRINGE FOR IV PUSH (FOR BLOOD PRESSURE SUPPORT)
PREFILLED_SYRINGE | INTRAVENOUS | Status: AC
Start: 2013-11-17 — End: 2013-11-17
  Filled 2013-11-17: qty 10

## 2013-11-17 MED ORDER — KETOROLAC TROMETHAMINE 30 MG/ML IJ SOLN: 15.0000 mg | Freq: Once | INTRAMUSCULAR | Status: DC | PRN

## 2013-11-17 MED ORDER — MORPHINE SULFATE 2 MG/ML IJ SOLN
2.0000 mg | INTRAMUSCULAR | Status: DC | PRN
  Administered 2013-11-17: 2 mg via INTRAVENOUS
  Administered 2013-11-17 – 2013-11-18 (×4): 6 mg via INTRAVENOUS
  Filled 2013-11-17: qty 1
  Filled 2013-11-17 (×5): qty 3

## 2013-11-17 MED ORDER — GLYCOPYRROLATE 0.2 MG/ML IJ SOLN
INTRAMUSCULAR | Status: AC
  Filled 2013-11-17: qty 2

## 2013-11-17 MED ORDER — SUCCINYLCHOLINE CHLORIDE 20 MG/ML IJ SOLN
INTRAMUSCULAR | Status: DC | PRN
  Administered 2013-11-17: 100 mg via INTRAVENOUS

## 2013-11-17 MED ORDER — CEFOXITIN SODIUM 2 G IV SOLR
2.0000 g | Freq: Four times a day (QID) | INTRAVENOUS | Status: DC
  Administered 2013-11-17: 2 g via INTRAVENOUS
  Filled 2013-11-17: qty 2

## 2013-11-17 MED ORDER — GLYCOPYRROLATE 0.2 MG/ML IJ SOLN
INTRAMUSCULAR | Status: AC
  Filled 2013-11-17: qty 1

## 2013-11-17 MED ORDER — PHENYLEPHRINE HCL 10 MG/ML IJ SOLN
INTRAMUSCULAR | Status: DC | PRN
  Administered 2013-11-17: 20 ug via INTRAVENOUS
  Administered 2013-11-17: 40 ug via INTRAVENOUS

## 2013-11-17 MED ORDER — UNJURY VANILLA POWDER
2.0000 [oz_av] | Freq: Four times a day (QID) | ORAL | Status: DC
  Administered 2013-11-19: 2 [oz_av] via ORAL

## 2013-11-17 MED ORDER — ACETAMINOPHEN 160 MG/5ML PO SOLN: 650.0000 mg | ORAL | Status: DC | PRN

## 2013-11-17 MED ORDER — UNJURY CHICKEN SOUP POWDER: 2.0000 [oz_av] | Freq: Four times a day (QID) | ORAL | Status: DC

## 2013-11-17 MED ORDER — SUCCINYLCHOLINE CHLORIDE 20 MG/ML IJ SOLN
INTRAMUSCULAR | Status: AC
  Filled 2013-11-17: qty 1

## 2013-11-17 MED ORDER — TISSEEL VH 10 ML EX KIT
PACK | CUTANEOUS | Status: AC
  Filled 2013-11-17: qty 1

## 2013-11-17 MED ORDER — OXYCODONE HCL 5 MG/5ML PO SOLN
5.0000 mg | ORAL | Status: DC | PRN
  Administered 2013-11-18: 5 mg via ORAL
  Administered 2013-11-18 – 2013-11-19 (×2): 10 mg via ORAL
  Filled 2013-11-17 (×2): qty 50
  Filled 2013-11-17: qty 25
  Filled 2013-11-17: qty 5

## 2013-11-17 MED ORDER — ONDANSETRON HCL 4 MG/2ML IJ SOLN
INTRAMUSCULAR | Status: DC | PRN
  Administered 2013-11-17: 4 mg via INTRAVENOUS

## 2013-11-17 MED ORDER — UNJURY CHOCOLATE CLASSIC POWDER: 2.0000 [oz_av] | Freq: Four times a day (QID) | ORAL | Status: DC

## 2013-11-17 MED ORDER — ACETAMINOPHEN 160 MG/5ML PO SOLN: 325.0000 mg | ORAL | Status: DC | PRN

## 2013-11-17 MED ORDER — MIDAZOLAM HCL 2 MG/2ML IJ SOLN
INTRAMUSCULAR | Status: AC
  Filled 2013-11-17: qty 2

## 2013-11-17 MED ORDER — ROCURONIUM BROMIDE 100 MG/10ML IV SOLN
INTRAVENOUS | Status: AC
  Filled 2013-11-17: qty 1

## 2013-11-17 MED ORDER — ONDANSETRON HCL 4 MG/2ML IJ SOLN
INTRAMUSCULAR | Status: AC
  Filled 2013-11-17: qty 2

## 2013-11-17 MED ORDER — PROPOFOL 10 MG/ML IV BOLUS
INTRAVENOUS | Status: AC
  Filled 2013-11-17: qty 20

## 2013-11-17 MED ORDER — ROCURONIUM BROMIDE 100 MG/10ML IV SOLN
INTRAVENOUS | Status: DC | PRN
  Administered 2013-11-17: 10 mg via INTRAVENOUS
  Administered 2013-11-17: 50 mg via INTRAVENOUS
  Administered 2013-11-17: 10 mg via INTRAVENOUS

## 2013-11-17 MED ORDER — KCL IN DEXTROSE-NACL 20-5-0.45 MEQ/L-%-% IV SOLN
INTRAVENOUS | Status: DC
  Administered 2013-11-18 – 2013-11-19 (×4): via INTRAVENOUS
  Filled 2013-11-17 (×6): qty 1000

## 2013-11-17 MED ORDER — LIDOCAINE HCL (PF) 2 % IJ SOLN
INTRAMUSCULAR | Status: DC | PRN
  Administered 2013-11-17: 20 mg via INTRADERMAL

## 2013-11-17 MED ORDER — LACTATED RINGERS IV SOLN
INTRAVENOUS | Status: DC
  Administered 2013-11-17: 16:00:00 via INTRAVENOUS
  Administered 2013-11-17: 1000 mL via INTRAVENOUS

## 2013-11-17 MED ORDER — HEPARIN SODIUM (PORCINE) 5000 UNIT/ML IJ SOLN
5000.0000 [IU] | Freq: Three times a day (TID) | INTRAMUSCULAR | Status: DC
  Administered 2013-11-17 – 2013-11-19 (×5): 5000 [IU] via SUBCUTANEOUS
  Filled 2013-11-17 (×8): qty 1

## 2013-11-17 MED ORDER — GLYCOPYRROLATE 0.2 MG/ML IJ SOLN
INTRAMUSCULAR | Status: DC | PRN
  Administered 2013-11-17: 0.6 mg via INTRAVENOUS

## 2013-11-17 MED ORDER — DEXAMETHASONE SODIUM PHOSPHATE 10 MG/ML IJ SOLN
INTRAMUSCULAR | Status: AC
  Filled 2013-11-17: qty 1

## 2013-11-17 MED ORDER — HYDROMORPHONE HCL PF 2 MG/ML IJ SOLN
INTRAMUSCULAR | Status: AC
  Filled 2013-11-17: qty 1

## 2013-11-17 MED ORDER — FENTANYL CITRATE 0.05 MG/ML IJ SOLN
INTRAMUSCULAR | Status: AC
  Filled 2013-11-17: qty 5

## 2013-11-17 MED ORDER — BUPIVACAINE LIPOSOME 1.3 % IJ SUSP
20.0000 mL | Freq: Once | INTRAMUSCULAR | Status: AC
  Administered 2013-11-17: 20 mL
  Filled 2013-11-17: qty 20

## 2013-11-17 MED ORDER — HYDROMORPHONE HCL PF 1 MG/ML IJ SOLN
INTRAMUSCULAR | Status: DC | PRN
  Administered 2013-11-17 (×2): 1 mg via INTRAVENOUS

## 2013-11-17 MED ORDER — HEPARIN SODIUM (PORCINE) 5000 UNIT/ML IJ SOLN
5000.0000 [IU] | INTRAMUSCULAR | Status: AC
  Administered 2013-11-17: 5000 [IU] via SUBCUTANEOUS
  Filled 2013-11-17: qty 1

## 2013-11-17 MED ORDER — CHLORHEXIDINE GLUCONATE 4 % EX LIQD: 60.0000 mL | Freq: Once | CUTANEOUS | Status: DC

## 2013-11-17 MED ORDER — CEFOXITIN SODIUM 2 G IV SOLR
2.0000 g | INTRAVENOUS | Status: AC
  Administered 2013-11-17: 2 g via INTRAVENOUS
  Filled 2013-11-17: qty 2

## 2013-11-17 MED ORDER — 0.9 % SODIUM CHLORIDE (POUR BTL) OPTIME
TOPICAL | Status: DC | PRN
  Administered 2013-11-17: 1000 mL

## 2013-11-17 MED ORDER — DEXTROSE 5 % IV SOLN
INTRAVENOUS | Status: AC
Start: 2013-11-17 — End: 2013-11-17
  Filled 2013-11-17 (×2): qty 1

## 2013-11-17 MED ORDER — TISSEEL VH 10 ML EX KIT
PACK | CUTANEOUS | Status: DC | PRN
  Administered 2013-11-17: 10 mL

## 2013-11-17 SURGICAL SUPPLY — 62 items
APPLICATOR COTTON TIP 6IN STRL (MISCELLANEOUS) IMPLANT
APPLIER CLIP ROT 10 11.4 M/L (STAPLE)
APPLIER CLIP ROT 13.4 12 LRG (CLIP) ×2
BENZOIN TINCTURE PRP APPL 2/3 (GAUZE/BANDAGES/DRESSINGS) ×2 IMPLANT
BLADE HEX COATED 2.75 (ELECTRODE) ×2 IMPLANT
BLADE SURG 15 STRL LF DISP TIS (BLADE) ×1 IMPLANT
BLADE SURG 15 STRL SS (BLADE) ×1
CABLE HIGH FREQUENCY MONO STRZ (ELECTRODE) ×2 IMPLANT
CLIP APPLIE ROT 10 11.4 M/L (STAPLE) IMPLANT
CLIP APPLIE ROT 13.4 12 LRG (CLIP) ×1 IMPLANT
CLOSURE STERI-STRIP 1/4X4 (GAUZE/BANDAGES/DRESSINGS) ×2 IMPLANT
DERMABOND ADVANCED (GAUZE/BANDAGES/DRESSINGS) ×1
DERMABOND ADVANCED .7 DNX12 (GAUZE/BANDAGES/DRESSINGS) ×1 IMPLANT
DEVICE SUT QUICK LOAD TK 5 (STAPLE) IMPLANT
DEVICE SUT TI-KNOT TK 5X26 (MISCELLANEOUS) IMPLANT
DEVICE SUTURE ENDOST 10MM (ENDOMECHANICALS) IMPLANT
DEVICE TROCAR PUNCTURE CLOSURE (ENDOMECHANICALS) IMPLANT
DRAIN CHANNEL 19F RND (DRAIN) IMPLANT
DRAPE CAMERA CLOSED 9X96 (DRAPES) ×2 IMPLANT
ELECT REM PT RETURN 9FT ADLT (ELECTROSURGICAL) ×2
ELECTRODE REM PT RTRN 9FT ADLT (ELECTROSURGICAL) ×1 IMPLANT
EVACUATOR SILICONE 100CC (DRAIN) IMPLANT
GLOVE BIOGEL M 8.0 STRL (GLOVE) ×2 IMPLANT
GOWN STRL REUS W/TWL XL LVL3 (GOWN DISPOSABLE) ×10 IMPLANT
HANDLE STAPLE EGIA 4 XL (STAPLE) IMPLANT
HOVERMATT SINGLE USE (MISCELLANEOUS) ×2 IMPLANT
KIT BASIN OR (CUSTOM PROCEDURE TRAY) ×2 IMPLANT
MANIFOLD NEPTUNE II (INSTRUMENTS) ×2 IMPLANT
NEEDLE SPNL 22GX3.5 QUINCKE BK (NEEDLE) ×2 IMPLANT
PACK UNIVERSAL I (CUSTOM PROCEDURE TRAY) ×2 IMPLANT
PENCIL BUTTON HOLSTER BLD 10FT (ELECTRODE) ×2 IMPLANT
RELOAD BLUE (STAPLE) ×6 IMPLANT
RELOAD ENDO STITCH (ENDOMECHANICALS) IMPLANT
RELOAD GOLD (STAPLE) ×4 IMPLANT
RELOAD GREEN (STAPLE) ×4 IMPLANT
SCISSORS LAP 5X45 EPIX DISP (ENDOMECHANICALS) ×2 IMPLANT
SCRUB PCMX 4 OZ (MISCELLANEOUS) ×4 IMPLANT
SEALANT SURGICAL APPL DUAL CAN (MISCELLANEOUS) ×2 IMPLANT
SET IRRIG TUBING LAPAROSCOPIC (IRRIGATION / IRRIGATOR) ×2 IMPLANT
SHEARS CURVED HARMONIC AC 45CM (MISCELLANEOUS) ×2 IMPLANT
SLEEVE ADV FIXATION 12X100MM (TROCAR) IMPLANT
SLEEVE ADV FIXATION 5X100MM (TROCAR) ×2 IMPLANT
SLEEVE GASTRECTOMY 36FR VISIGI (MISCELLANEOUS) ×2 IMPLANT
SOLUTION ANTI FOG 6CC (MISCELLANEOUS) ×2 IMPLANT
SPONGE GAUZE 4X4 12PLY (GAUZE/BANDAGES/DRESSINGS) IMPLANT
SPONGE LAP 18X18 X RAY DECT (DISPOSABLE) ×2 IMPLANT
STAPLE ECHEON FLEX 60 POW ENDO (STAPLE) ×2 IMPLANT
SUT ETHILON 2 0 PS N (SUTURE) IMPLANT
SUT VIC AB 4-0 SH 18 (SUTURE) ×2 IMPLANT
SUT VICRYL 0 UR6 27IN ABS (SUTURE) ×2 IMPLANT
SYR 20CC LL (SYRINGE) ×2 IMPLANT
SYR 50ML LL SCALE MARK (SYRINGE) ×2 IMPLANT
TOWEL OR 17X26 10 PK STRL BLUE (TOWEL DISPOSABLE) ×4 IMPLANT
TOWEL OR NON WOVEN STRL DISP B (DISPOSABLE) ×2 IMPLANT
TRAY FOLEY CATH 14FRSI W/METER (CATHETERS) ×2 IMPLANT
TROCAR ADV FIXATION 12X100MM (TROCAR) ×2 IMPLANT
TROCAR ADV FIXATION 5X100MM (TROCAR) ×2 IMPLANT
TROCAR BLADELESS 15MM (ENDOMECHANICALS) ×2 IMPLANT
TROCAR XCEL NON-BLD 5MMX100MML (ENDOMECHANICALS) ×2 IMPLANT
TUBING CONNECTING 10 (TUBING) ×2 IMPLANT
TUBING ENDO SMARTCAP (MISCELLANEOUS) ×2 IMPLANT
TUBING FILTER THERMOFLATOR (ELECTROSURGICAL) ×2 IMPLANT

## 2013-11-17 NOTE — H&P (View-Only) (Signed)
Patient ID: Marie Martinez, female   DOB: Feb 02, 1946, 68 y.o.   MRN: [id number]  Chief Complaint  Patient presents with  . Bariatric Pre-op    Pre-Op Sleeve    HPI Marie Martinez is a 68 y.o. female.  This patient presents for her initial weight loss surgery evaluation. She has a BMI of 47.6 with comorbidities of depression, arthritis, hypertension, and congestive heart failure.  She has attended an  information session and is most interested in a vertical sleeve gastrectomy.  She has done the 6 months supervised weight loss.  She has struggled with her weight since delayed-release attributes this to some of the medication that she was placed on for treatment of arthritis and extremity pains. Since then she is off her chronic pain medication but still has issues with severe arthritis and is limited in her activity. She says that she can walk about 2 city blocks before she will get short of breath. About 3 years ago she was admitted to the hospital for treatment of CHF and her doctor is Dr. Karren Burly. She is also followed by a psychologist, Dr Milly Jakob.  She has tried several diets including Weight Watchers and Nutrisystem in the Nutrisystem seem to work the best where she lost about 40 pounds in 6 months but regained the weight. She denies any history of heartburn or reflux. She denies any active chest pain and says that she had a normal cardiac catheter about 3 years ago.  She is now eager to schedule a sleeve gastrectomy HPI  Past Medical History  Diagnosis Date  . Arthritis   . Blood transfusion without reported diagnosis   . CHF (congestive heart failure)   . Hypertension   . Osteoarthritis   . Obesity     Past Surgical History  Procedure Laterality Date  . Abdominal hysterectomy  1990    bladder tack  . Foot surgery Bilateral late 80's  . Heart ablation  2012  . Bladder suspension    . Toe surgery      Family History  Problem Relation Age of Onset  . Cancer Mother      breast  . Dementia Mother   . Heart disease Father   . Cancer Maternal Aunt     breast  . Cancer Cousin     breast    Social History History  Substance Use Topics  . Smoking status: Never Smoker   . Smokeless tobacco: Not on file  . Alcohol Use: Yes     Comment: wine socially    Allergies  Allergen Reactions  . Eggs Or Egg-Derived Products Nausea And Vomiting  . Sulfur Nausea Only    Current Outpatient Prescriptions  Medication Sig Dispense Refill  . buPROPion (WELLBUTRIN XL) 150 MG 24 hr tablet Take 300 mg by mouth every morning.      . busPIRone (BUSPAR) 15 MG tablet Take 30 mg by mouth 2 (two) times daily.       . carvedilol (COREG) 12.5 MG tablet Take 12.5 mg by mouth 2 (two) times daily with a meal.       . CELEBREX 200 MG capsule Take 200 mg by mouth 2 (two) times daily.       . DULoxetine (CYMBALTA) 60 MG capsule Take 120 mg by mouth every morning.       . furosemide (LASIX) 20 MG tablet Take 20 mg by mouth daily as needed for fluid.       Marland Kitchen gabapentin (NEURONTIN) 600 MG  tablet Take 1,800 mg by mouth at bedtime.       . hydroxychloroquine (PLAQUENIL) 200 MG tablet Take 200 mg by mouth 2 (two) times daily.       Marland Kitchen. latanoprost (XALATAN) 0.005 % ophthalmic solution Place 1 drop into both eyes at bedtime.       . Melatonin 10 MG TABS Take 10 mg by mouth at bedtime.      . Oxcarbazepine (TRILEPTAL) 300 MG tablet Take 300 mg by mouth at bedtime.       Marland Kitchen. oxycodone (OXY-IR) 5 MG capsule Take 5 mg by mouth every 4 (four) hours as needed for pain.       Marland Kitchen. rOPINIRole (REQUIP) 2 MG tablet Take 2 mg by mouth at bedtime.       Marland Kitchen. tiZANidine (ZANAFLEX) 4 MG tablet        No current facility-administered medications for this visit.    Review of Systems Review of Systems All other review of systems negative or noncontributory except as stated in the HPI  Blood pressure 118/84, pulse 71, temperature 97.8 F (36.6 C), temperature source Temporal, resp. rate 18, height 5\' 6"  (1.676  m), weight 285 lb (129.275 kg).  Physical Exam Physical Exam Physical Exam  Nursing note and vitals reviewed. Constitutional: She is oriented to person, place, and time. She appears well-developed and well-nourished. No distress.  HENT:  Head: Normocephalic and atraumatic.  Mouth/Throat: No oropharyngeal exudate.  Eyes: Conjunctivae and EOM are normal. Pupils are equal, round, and reactive to light. Right eye exhibits no discharge. Left eye exhibits no discharge. No scleral icterus.  Neck: Normal range of motion. Neck supple. No tracheal deviation present.  Cardiovascular: Normal rate, regular rhythm, normal heart sounds and intact distal pulses.   Pulmonary/Chest: Effort normal and breath sounds normal. No stridor. No respiratory distress. She has no wheezes.  Abdominal: Soft. Bowel sounds are normal. She exhibits no distension and no mass. There is no tenderness. There is no rebound and no guarding.  Musculoskeletal: Normal range of motion. She exhibits no edema and no tenderness.  Neurological: She is alert and oriented to person, place, and time.  Skin: Skin is warm and dry. No rash noted. She is not diaphoretic. No erythema. No pallor.  Psychiatric: She has a normal mood and affect. Her behavior is normal. Judgment and thought content normal.     Data Reviewed   Assessment    Morbid obesity with a BMI of 47.7 and comorbidities of depression, arthritis, hypertension, and congestive heart failure she has had thorough discussions of the risks of surgery and the benefits. Her main issue has been her arthritis and joint pain. She wants to go ahead and proceed with sleeve gastrectomy. She has the approval of her cardiologist. she has an appointment to visit with the dietitians in a few weeks.  She has completed the six month physician supervised weight loss.  UGI showed a small sliding hiatus hernia.    Plan    Laparoscopic sleeve gastrectomy   Yusif Gnau B 11/13/2013, 10:33  AM

## 2013-11-17 NOTE — Op Note (Signed)
Surgeon: Wenda LowMatt Merve Hotard, MD, FACS  Asst:  Jaclynn GuarneriBen Hoxworth, MD, FACS  Anes:  General endotracheal  Procedure: Laparoscopic sleeve gastrectomy,  Upper endoscopy per Dr. Johna SheriffHoxworth  Diagnosis: Morbid obesity  Complications: none  EBL:   30 cc  Description of Procedure:  The patient was take to OR 1 and given general anesthesia.  The abdomen was prepped with PCMX and draped sterilely.  A timeout was performed.  Access to the abdomen was achieved with 5 mm 0 degree Optiview without problems.  Following insufflation, the state of the abdomen was found to be protuberant .  The ViSiGi 36Fr tube was inserted to deflate the stomach and was pulled back into the esophagus.    The pylorus was identified and we measured 6 cm back and marked the antrum.  At that point we began dissection to take down the greater curvature of the stomach using the Harmonic scalpel.  This dissection was taken all the way up to the left crus.  Posterior attachments of the stomach were also taken down.    The ViSiGi tube was then passed into the antrum and suction applied so that it was snug along the lessor curvature.  The "crow's foot" or incisura was identified.  The sleeve gastrectomy was begun using the Echelon stapler beginning with a Green load followed by 2 gold load and then the remaining blue staples.  When the sleeve was complete the tube was taken off suction and insufflated briefly.  The tube was withdrawn.  Upper endoscopy was then performed by Dr. Johna SheriffHoxworth and showed a symmetrical sleeve and no leaks.    Tisseal was applied along the staple line.  The specimen was extracted through the 15 trocar site.  Wounds were infiltrated with Exparel and closed with 4-0 vicryl-the fascia of the 15 was close with 0 Vicryl.    Marie B. Daphine DeutscherMartin, MD, [facility], [place] [phone]

## 2013-11-17 NOTE — Interval H&P Note (Signed)
History and Physical Interval Note:  11/17/2013 12:11 PM  Marie Martinez  has presented today for surgery, with the diagnosis of morbid obesity   The various methods of treatment have been discussed with the patient and family. After consideration of risks, benefits and other options for treatment, the patient has consented to  Procedure(s): LAPAROSCOPIC GASTRIC SLEEVE RESECTION (N/A) as a surgical intervention .  The patient's history has been reviewed, patient examined, no change in status, stable for surgery.  I have reviewed the patient's chart and labs.  Questions were answered to the patient's satisfaction.     Randen Kauth B

## 2013-11-17 NOTE — Preoperative (Signed)
Beta Blockers   Reason not to administer Beta Blockers:Not Applicable, took this am 

## 2013-11-17 NOTE — Transfer of Care (Signed)
Immediate Anesthesia Transfer of Care Note  Patient: Marie Martinez  Procedure(s) Performed: Procedure(s) (LRB): LAPAROSCOPIC GASTRIC SLEEVE RESECTION (N/A)  Patient Location: PACU  Anesthesia Type: General  Level of Consciousness: sedated, patient cooperative and responds to stimulation  Airway & Oxygen Therapy: Patient Spontanous Breathing and Patient connected to face mask oxgen  Post-op Assessment: Report given to PACU RN and Post -op Vital signs reviewed and stable  Post vital signs: Reviewed and stable  Complications: No apparent anesthesia complications

## 2013-11-17 NOTE — Anesthesia Preprocedure Evaluation (Addendum)
Anesthesia Evaluation  Patient identified by MRN, date of birth, ID band Patient awake    Reviewed: Allergy & Precautions, H&P , NPO status , Patient's Chart, lab work & pertinent test results  Airway Mallampati: II TM Distance: <3 FB Neck ROM: Full    Dental  (+) Edentulous Upper, Edentulous Lower   Pulmonary neg pulmonary ROS,  breath sounds clear to auscultation  Pulmonary exam normal       Cardiovascular hypertension, Pt. on medications Rhythm:Regular Rate:Normal  History of atrial fibrillation   had cardioversion feb 2012              Neuro/Psych negative neurological ROS  negative psych ROS   GI/Hepatic negative GI ROS, Neg liver ROS,   Endo/Other  Morbid obesity  Renal/GU negative Renal ROS  negative genitourinary   Musculoskeletal negative musculoskeletal ROS (+)   Abdominal   Peds negative pediatric ROS (+)  Hematology negative hematology ROS (+)   Anesthesia Other Findings   Reproductive/Obstetrics negative OB ROS                          Anesthesia Physical Anesthesia Plan  ASA: III  Anesthesia Plan: General   Post-op Pain Management:    Induction: Intravenous  Airway Management Planned: Oral ETT  Additional Equipment:   Intra-op Plan:   Post-operative Plan: Extubation in OR  Informed Consent: I have reviewed the patients History and Physical, chart, labs and discussed the procedure including the risks, benefits and alternatives for the proposed anesthesia with the patient or authorized representative who has indicated his/her understanding and acceptance.   Dental advisory given  Plan Discussed with: CRNA and Surgeon  Anesthesia Plan Comments:         Anesthesia Quick Evaluation

## 2013-11-18 ENCOUNTER — Inpatient Hospital Stay (HOSPITAL_COMMUNITY): Payer: Medicare PPO

## 2013-11-18 DIAGNOSIS — Z48812 Encounter for surgical aftercare following surgery on the circulatory system: Secondary | ICD-10-CM

## 2013-11-18 LAB — CBC WITH DIFFERENTIAL/PLATELET
BASOS ABS: 0 10*3/uL (ref 0.0–0.1)
BASOS PCT: 0 % (ref 0–1)
EOS ABS: 0 10*3/uL (ref 0.0–0.7)
Eosinophils Relative: 0 % (ref 0–5)
HCT: 34.6 % — ABNORMAL LOW (ref 36.0–46.0)
Hemoglobin: 11.8 g/dL — ABNORMAL LOW (ref 12.0–15.0)
Lymphocytes Relative: 7 % — ABNORMAL LOW (ref 12–46)
Lymphs Abs: 0.5 10*3/uL — ABNORMAL LOW (ref 0.7–4.0)
MCH: 27.6 pg (ref 26.0–34.0)
MCHC: 34.1 g/dL (ref 30.0–36.0)
MCV: 80.8 fL (ref 78.0–100.0)
Monocytes Absolute: 0.5 10*3/uL (ref 0.1–1.0)
Monocytes Relative: 6 % (ref 3–12)
NEUTROS ABS: 6.8 10*3/uL (ref 1.7–7.7)
NEUTROS PCT: 87 % — AB (ref 43–77)
PLATELETS: 339 10*3/uL (ref 150–400)
RBC: 4.28 MIL/uL (ref 3.87–5.11)
RDW: 12.8 % (ref 11.5–15.5)
WBC: 7.8 10*3/uL (ref 4.0–10.5)

## 2013-11-18 LAB — HEMOGLOBIN AND HEMATOCRIT, BLOOD
HCT: 35.7 % — ABNORMAL LOW (ref 36.0–46.0)
Hemoglobin: 11.8 g/dL — ABNORMAL LOW (ref 12.0–15.0)

## 2013-11-18 MED ORDER — DULOXETINE HCL 60 MG PO CPEP
120.0000 mg | ORAL_CAPSULE | Freq: Every morning | ORAL | Status: DC
  Administered 2013-11-18 – 2013-11-19 (×2): 120 mg via ORAL
  Filled 2013-11-18 (×2): qty 2

## 2013-11-18 MED ORDER — GABAPENTIN 600 MG PO TABS
1800.0000 mg | ORAL_TABLET | Freq: Every day | ORAL | Status: DC
  Filled 2013-11-18: qty 3

## 2013-11-18 MED ORDER — BUSPIRONE HCL 15 MG PO TABS
30.0000 mg | ORAL_TABLET | Freq: Two times a day (BID) | ORAL | Status: DC
  Administered 2013-11-18 (×2): 30 mg via ORAL
  Filled 2013-11-18 (×4): qty 2

## 2013-11-18 MED ORDER — OXCARBAZEPINE 300 MG PO TABS
300.0000 mg | ORAL_TABLET | Freq: Every day | ORAL | Status: DC
  Administered 2013-11-18: 300 mg via ORAL
  Filled 2013-11-18 (×2): qty 1

## 2013-11-18 MED ORDER — ROPINIROLE HCL 1 MG PO TABS
2.0000 mg | ORAL_TABLET | Freq: Every day | ORAL | Status: DC
  Administered 2013-11-18: 2 mg via ORAL
  Filled 2013-11-18 (×2): qty 2

## 2013-11-18 MED ORDER — IOHEXOL 300 MG/ML  SOLN
50.0000 mL | Freq: Once | INTRAMUSCULAR | Status: AC | PRN
  Administered 2013-11-18: 50 mL via ORAL

## 2013-11-18 MED ORDER — LATANOPROST 0.005 % OP SOLN
1.0000 [drp] | Freq: Every day | OPHTHALMIC | Status: DC
  Administered 2013-11-18: 1 [drp] via OPHTHALMIC
  Filled 2013-11-18: qty 2.5

## 2013-11-18 MED ORDER — HYDROXYCHLOROQUINE SULFATE 200 MG PO TABS
200.0000 mg | ORAL_TABLET | Freq: Two times a day (BID) | ORAL | Status: DC
  Administered 2013-11-18 (×2): 200 mg via ORAL
  Filled 2013-11-18 (×4): qty 1

## 2013-11-18 MED ORDER — GABAPENTIN 400 MG PO CAPS
1800.0000 mg | ORAL_CAPSULE | Freq: Every day | ORAL | Status: DC
  Administered 2013-11-18: 1800 mg via ORAL
  Filled 2013-11-18 (×2): qty 2

## 2013-11-18 MED ORDER — BUPROPION HCL ER (XL) 300 MG PO TB24
300.0000 mg | ORAL_TABLET | Freq: Every morning | ORAL | Status: DC
  Administered 2013-11-18 – 2013-11-19 (×2): 300 mg via ORAL
  Filled 2013-11-18 (×2): qty 1

## 2013-11-18 NOTE — Progress Notes (Signed)
Utilization review completed.  

## 2013-11-18 NOTE — Anesthesia Postprocedure Evaluation (Signed)
  Anesthesia Post-op Note  Patient: Marie SailorsBetsy P Dettinger  Procedure(s) Performed: Procedure(s) (LRB): LAPAROSCOPIC GASTRIC SLEEVE RESECTION (N/A)  Patient Location: PACU  Anesthesia Type: General  Level of Consciousness: awake and alert   Airway and Oxygen Therapy: Patient Spontanous Breathing  Post-op Pain: mild  Post-op Assessment: Post-op Vital signs reviewed, Patient's Cardiovascular Status Stable, Respiratory Function Stable, Patent Airway and No signs of Nausea or vomiting  Last Vitals:  Filed Vitals:   11/18/13 0547  BP: 153/74  Pulse: 87  Temp: 37 C  Resp: 18    Post-op Vital Signs: stable   Complications: No apparent anesthesia complications

## 2013-11-18 NOTE — Progress Notes (Signed)
Dr. Daphine DeutscherMartin notified of patient's swallow study, normal post gastric bypass and no leaks. Patient requesting medication for sleep. Patient now resting as she recently received Morphine 6 mg, IV.

## 2013-11-18 NOTE — Care Management Note (Signed)
    Page 1 of 1   11/18/2013     10:56:31 AM   CARE MANAGEMENT NOTE 11/18/2013  Patient:  Trey SailorsRICHARDSON,Dariela P   Account Number:  [id number]  Date Initiated:  11/18/2013  Documentation initiated by:  Lorenda IshiharaPEELE,Neeta Storey  Subjective/Objective Assessment:   68 yo female admitted s/p sleeve gastrectomy. PTA lived at home with spouse.     Action/Plan:   Home when stable   Anticipated DC Date:  11/20/2013   Anticipated DC Plan:  HOME/SELF CARE      DC Planning Services  CM consult      Choice offered to / List presented to:             Status of service:  Completed, signed off Medicare Important Message given?   (If response is "NO", the following Medicare IM given date fields will be blank) Date Medicare IM given:   Date Additional Medicare IM given:    Discharge Disposition:  HOME/SELF CARE  Per UR Regulation:  Reviewed for med. necessity/level of care/duration of stay  If discussed at Long Length of Stay Meetings, dates discussed:    Comments:

## 2013-11-18 NOTE — Progress Notes (Signed)
VASCULAR LAB PRELIMINARY  PRELIMINARY  PRELIMINARY  PRELIMINARY  Bilateral lower extremity venous duplex  completed.    Preliminary report:  Bilateral:  No evidence of DVT, superficial thrombosis, or Baker's Cyst.    Curties Conigliaro, RVT 11/18/2013, 11:03 AM

## 2013-11-18 NOTE — Progress Notes (Signed)
Spoke with patient, she is very upset about timeline, feels like her UGI was done late.  Explained the timeline and what to expect.  Patient continues to be upset, asked me to leave so that she could, "just be miserable".  Informed patient that as soon as her swallow study resulted I would call DR. Daphine DeutscherMartin to start her on water and ice chips, PO pain medication, and restarting home medication.  Will continue to monitor.

## 2013-11-18 NOTE — Progress Notes (Signed)
Patient ID: Marie Martinez, female   DOB: August 13, 1946, 68 y.o.   MRN: [id number] [name] Progress Note:   1 Day Post-Op  Subjective: Mental status is clear and less agitated.  She was a bit cranky this morning and blamed the absence of her meds.   Objective: Vital signs in last 24 hours: Temp:  [97.1 F (36.2 C)-98.7 F (37.1 C)] 98.7 F (37.1 C) (02/24 1000) Pulse Rate:  [68-91] 75 (02/24 1000) Resp:  [12-18] 18 (02/24 1000) BP: (109-178)/(57-98) 164/82 mmHg (02/24 1000) SpO2:  [93 %-100 %] 97 % (02/24 1000)  Intake/Output from previous day: 02/23 0701 - 02/24 0700 In: 1581.7 [I.V.:1581.7] Out: 2150 [Urine:2150] Intake/Output this shift: Total I/O In: 325 [I.V.:325] Out: 0   Physical Exam: Work of breathing is not labored.  No increase abdominal pain.    Lab Results:  Results for orders placed during the hospital encounter of 11/17/13 (from the past 48 hour(s))  HEMOGLOBIN AND HEMATOCRIT, BLOOD     Status: None   Collection Time    11/17/13  4:20 PM      Result Value Ref Range   Hemoglobin 12.2  12.0 - 15.0 g/dL   HCT 36.5  36.0 - 46.0 %  CBC     Status: None   Collection Time    11/17/13  6:19 PM      Result Value Ref Range   WBC 10.4  4.0 - 10.5 K/uL   RBC 4.57  3.87 - 5.11 MIL/uL   Hemoglobin 12.3  12.0 - 15.0 g/dL   HCT 37.1  36.0 - 46.0 %   MCV 81.2  78.0 - 100.0 fL   MCH 26.9  26.0 - 34.0 pg   MCHC 33.2  30.0 - 36.0 g/dL   RDW 12.9  11.5 - 15.5 %   Platelets 303  150 - 400 K/uL  CREATININE, SERUM     Status: Abnormal   Collection Time    11/17/13  6:19 PM      Result Value Ref Range   Creatinine, Ser 0.81  0.50 - 1.10 mg/dL   GFR calc non Af Amer 73 (*) >90 mL/min   GFR calc Af Amer 85 (*) >90 mL/min   Comment: (NOTE)     The eGFR has been calculated using the CKD EPI equation.     This calculation has not been validated in all clinical situations.     eGFR's persistently <90 mL/min signify possible Chronic Kidney     Disease.   GLUCOSE, CAPILLARY     Status: Abnormal   Collection Time    11/17/13  6:49 PM      Result Value Ref Range   Glucose-Capillary 118 (*) 70 - 99 mg/dL  CBC WITH DIFFERENTIAL     Status: Abnormal   Collection Time    11/18/13  4:35 AM      Result Value Ref Range   WBC 7.8  4.0 - 10.5 K/uL   RBC 4.28  3.87 - 5.11 MIL/uL   Hemoglobin 11.8 (*) 12.0 - 15.0 g/dL   HCT 34.6 (*) 36.0 - 46.0 %   MCV 80.8  78.0 - 100.0 fL   MCH 27.6  26.0 - 34.0 pg   MCHC 34.1  30.0 - 36.0 g/dL   RDW 12.8  11.5 - 15.5 %   Platelets 339  150 - 400 K/uL   Neutrophils Relative % 87 (*) 43 - 77 %   Neutro Abs 6.8  1.7 -  7.7 K/uL   Lymphocytes Relative 7 (*) 12 - 46 %   Lymphs Abs 0.5 (*) 0.7 - 4.0 K/uL   Monocytes Relative 6  3 - 12 %   Monocytes Absolute 0.5  0.1 - 1.0 K/uL   Eosinophils Relative 0  0 - 5 %   Eosinophils Absolute 0.0  0.0 - 0.7 K/uL   Basophils Relative 0  0 - 1 %   Basophils Absolute 0.0  0.0 - 0.1 K/uL    Radiology/Results: Dg Ugi W/water Sol Cm  11/18/2013   CLINICAL DATA:  Gastric sleeve appear  EXAM: WATER SOLUBLE UPPER GI SERIES  TECHNIQUE: Single-column upper GI series was performed using water soluble contrast.  CONTRAST:  4m OMNIPAQUE IOHEXOL 300 MG/ML  SOLN  COMPARISON:  DG UGI W/KUB dated 02/03/2013  FLUOROSCOPY TIME:  1 min 18 seconds .  FINDINGS: Small hiatal hernia. Stomach remnant appears patent. Antrum appears normal. The duodenum is normal. No contrast leak.  IMPRESSION: Normal exam post gastric sleeve.   Electronically Signed   By: TMarcello Moores Register   On: 11/18/2013 11:06    Anti-infectives: Anti-infectives   Start     Dose/Rate Route Frequency Ordered Stop   11/18/13 1300  hydroxychloroquine (PLAQUENIL) tablet 200 mg     200 mg Oral 2 times daily 11/18/13 1137     11/17/13 1505  cefOXitin (MEFOXIN) 2 g in dextrose 5 % 50 mL IVPB  Status:  Discontinued     2 g 100 mL/hr over 30 Minutes Intravenous 4 times per day 11/17/13 1502 11/17/13 1647   11/17/13 1103  cefOXitin  (MEFOXIN) 2 g in dextrose 5 % 50 mL IVPB     2 g 100 mL/hr over 30 Minutes Intravenous On call to O.R. 11/17/13 1103 11/17/13 1310      Assessment/Plan: Problem List: Patient Active Problem List   Diagnosis Date Noted  . 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 Depression 11/13/2013  . Arthritis 11/13/2013  . Essential hypertension, benign 11/13/2013  . Morbid obesity 10/15/2013    Psych meds restarted.  UGI looks good.  Start PD 1 diet.   1 Day Post-Op    LOS: 1 day   Matt B. MHassell Done MD, [facility] [phone] [phone] 11/18/2013 12:01 PM

## 2013-11-19 ENCOUNTER — Encounter (HOSPITAL_COMMUNITY): Payer: Self-pay | Admitting: Surgery

## 2013-11-19 LAB — CBC WITH DIFFERENTIAL/PLATELET
Basophils Absolute: 0 10*3/uL (ref 0.0–0.1)
Basophils Relative: 0 % (ref 0–1)
EOS ABS: 0 10*3/uL (ref 0.0–0.7)
EOS PCT: 0 % (ref 0–5)
HCT: 34.5 % — ABNORMAL LOW (ref 36.0–46.0)
Hemoglobin: 11.2 g/dL — ABNORMAL LOW (ref 12.0–15.0)
LYMPHS ABS: 1.3 10*3/uL (ref 0.7–4.0)
Lymphocytes Relative: 17 % (ref 12–46)
MCH: 27.1 pg (ref 26.0–34.0)
MCHC: 32.5 g/dL (ref 30.0–36.0)
MCV: 83.3 fL (ref 78.0–100.0)
Monocytes Absolute: 0.7 10*3/uL (ref 0.1–1.0)
Monocytes Relative: 10 % (ref 3–12)
Neutro Abs: 5.5 10*3/uL (ref 1.7–7.7)
Neutrophils Relative %: 73 % (ref 43–77)
Platelets: 290 10*3/uL (ref 150–400)
RBC: 4.14 MIL/uL (ref 3.87–5.11)
RDW: 13.1 % (ref 11.5–15.5)
WBC: 7.6 10*3/uL (ref 4.0–10.5)

## 2013-11-19 NOTE — Discharge Instructions (Signed)

## 2013-11-19 NOTE — Progress Notes (Signed)
Patient ID: Marie Martinez, female   DOB: 25-Aug-1946, 68 y.o.   MRN: [id number] [facility] Progress Note:   2 Days Post-Op  Subjective: Mental status is clear and attitude is better Objective: Vital signs in last 24 hours: Temp:  [97.9 F (36.6 C)-99.1 F (37.3 C)] 97.9 F (36.6 C) (02/25 0516) Pulse Rate:  [70-101] 82 (02/25 0516) Resp:  [17-18] 17 (02/25 0516) BP: (150-175)/(70-92) 167/92 mmHg (02/25 0516) SpO2:  [92 %-100 %] 96 % (02/25 0516)  Intake/Output from previous day: 02/24 0701 - 02/25 0700 In: 1166.7 [I.V.:1166.7] Out: 400 [Urine:400] Intake/Output this shift:    Physical Exam: Work of breathing is normal.  No abdominal complaints.   Lab Results:  Results for orders placed during the hospital encounter of 11/17/13 (from the past 48 hour(s))  HEMOGLOBIN AND HEMATOCRIT, BLOOD     Status: None   Collection Time    11/17/13  4:20 PM      Result Value Ref Range   Hemoglobin 12.2  12.0 - 15.0 g/dL   HCT 36.5  36.0 - 46.0 %  CBC     Status: None   Collection Time    11/17/13  6:19 PM      Result Value Ref Range   WBC 10.4  4.0 - 10.5 K/uL   RBC 4.57  3.87 - 5.11 MIL/uL   Hemoglobin 12.3  12.0 - 15.0 g/dL   HCT 37.1  36.0 - 46.0 %   MCV 81.2  78.0 - 100.0 fL   MCH 26.9  26.0 - 34.0 pg   MCHC 33.2  30.0 - 36.0 g/dL   RDW 12.9  11.5 - 15.5 %   Platelets 303  150 - 400 K/uL  CREATININE, SERUM     Status: Abnormal   Collection Time    11/17/13  6:19 PM      Result Value Ref Range   Creatinine, Ser 0.81  0.50 - 1.10 mg/dL   GFR calc non Af Amer 73 (*) >90 mL/min   GFR calc Af Amer 85 (*) >90 mL/min   Comment: (NOTE)     The eGFR has been calculated using the CKD EPI equation.     This calculation has not been validated in all clinical situations.     eGFR's persistently <90 mL/min signify possible Chronic Kidney     Disease.  GLUCOSE, CAPILLARY     Status: Abnormal   Collection Time    11/17/13  6:49 PM      Result Value Ref Range   Glucose-Capillary 118 (*) 70 - 99 mg/dL  CBC WITH DIFFERENTIAL     Status: Abnormal   Collection Time    11/18/13  4:35 AM      Result Value Ref Range   WBC 7.8  4.0 - 10.5 K/uL   RBC 4.28  3.87 - 5.11 MIL/uL   Hemoglobin 11.8 (*) 12.0 - 15.0 g/dL   HCT 34.6 (*) 36.0 - 46.0 %   MCV 80.8  78.0 - 100.0 fL   MCH 27.6  26.0 - 34.0 pg   MCHC 34.1  30.0 - 36.0 g/dL   RDW 12.8  11.5 - 15.5 %   Platelets 339  150 - 400 K/uL   Neutrophils Relative % 87 (*) 43 - 77 %   Neutro Abs 6.8  1.7 - 7.7 K/uL   Lymphocytes Relative 7 (*) 12 - 46 %   Lymphs Abs 0.5 (*) 0.7 - 4.0 K/uL   Monocytes Relative  6  3 - 12 %   Monocytes Absolute 0.5  0.1 - 1.0 K/uL   Eosinophils Relative 0  0 - 5 %   Eosinophils Absolute 0.0  0.0 - 0.7 K/uL   Basophils Relative 0  0 - 1 %   Basophils Absolute 0.0  0.0 - 0.1 K/uL  HEMOGLOBIN AND HEMATOCRIT, BLOOD     Status: Abnormal   Collection Time    11/18/13  4:03 PM      Result Value Ref Range   Hemoglobin 11.8 (*) 12.0 - 15.0 g/dL   HCT 35.7 (*) 36.0 - 46.0 %  CBC WITH DIFFERENTIAL     Status: Abnormal   Collection Time    11/19/13  5:32 AM      Result Value Ref Range   WBC 7.6  4.0 - 10.5 K/uL   RBC 4.14  3.87 - 5.11 MIL/uL   Hemoglobin 11.2 (*) 12.0 - 15.0 g/dL   HCT 34.5 (*) 36.0 - 46.0 %   MCV 83.3  78.0 - 100.0 fL   MCH 27.1  26.0 - 34.0 pg   MCHC 32.5  30.0 - 36.0 g/dL   RDW 13.1  11.5 - 15.5 %   Platelets 290  150 - 400 K/uL   Neutrophils Relative % 73  43 - 77 %   Neutro Abs 5.5  1.7 - 7.7 K/uL   Lymphocytes Relative 17  12 - 46 %   Lymphs Abs 1.3  0.7 - 4.0 K/uL   Monocytes Relative 10  3 - 12 %   Monocytes Absolute 0.7  0.1 - 1.0 K/uL   Eosinophils Relative 0  0 - 5 %   Eosinophils Absolute 0.0  0.0 - 0.7 K/uL   Basophils Relative 0  0 - 1 %   Basophils Absolute 0.0  0.0 - 0.1 K/uL    Radiology/Results: Dg Ugi W/water Sol Cm  11/18/2013   CLINICAL DATA:  Gastric sleeve appear  EXAM: WATER SOLUBLE UPPER GI SERIES  TECHNIQUE: Single-column  upper GI series was performed using water soluble contrast.  CONTRAST:  15m OMNIPAQUE IOHEXOL 300 MG/ML  SOLN  COMPARISON:  DG UGI W/KUB dated 02/03/2013  FLUOROSCOPY TIME:  1 min 18 seconds .  FINDINGS: Small hiatal hernia. Stomach remnant appears patent. Antrum appears normal. The duodenum is normal. No contrast leak.  IMPRESSION: Normal exam post gastric sleeve.   Electronically Signed   By: TMarcello Moores Register   On: 11/18/2013 11:06    Anti-infectives: Anti-infectives   Start     Dose/Rate Route Frequency Ordered Stop   11/18/13 1300  hydroxychloroquine (PLAQUENIL) tablet 200 mg     200 mg Oral 2 times daily 11/18/13 1137     11/17/13 1505  cefOXitin (MEFOXIN) 2 g in dextrose 5 % 50 mL IVPB  Status:  Discontinued     2 g 100 mL/hr over 30 Minutes Intravenous 4 times per day 11/17/13 1502 11/17/13 1647   11/17/13 1103  cefOXitin (MEFOXIN) 2 g in dextrose 5 % 50 mL IVPB     2 g 100 mL/hr over 30 Minutes Intravenous On call to O.R. 11/17/13 1103 11/17/13 1310      Assessment/Plan: Problem List: Patient Active Problem List   Diagnosis Date Noted  . 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 Depression 11/13/2013  . Arthritis 11/13/2013  . Essential hypertension, benign 11/13/2013  . Morbid obesity 10/15/2013    Advance to PD 2 diet and anticipate discharge later today.  2  Days Post-Op    LOS: 2 days   Matt B. Hassell Done, MD, [facility] [phone] beeper [phone]  11/19/2013 9:29 AM

## 2013-11-19 NOTE — Progress Notes (Signed)
Patient alert and oriented, pain is controlled. Patient is tolerating fluids, plan to advance to protein shake today. Reviewed Gastric Bypass discharge instructions with patient and patient is able to articulate understanding. Provided information on BELT program, Support Group and [facility] outpatient pharmacy. All questions answered, will continue to monitor.  

## 2013-11-19 NOTE — Discharge Summary (Signed)
Physician Discharge Summary  Patient ID: Marie SailorsBetsy P Martinez MRN: [id number] DOB/AGE: 1945/12/21 68 y.o.  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  UGI ok.  Restarted home meds. Ready for discharge on PD 2 after successfully taking diet  Consults: none  Significant Diagnostic Studies: UGI    Discharge Exam: Blood pressure 167/92, pulse 82, temperature 97.9 F (36.6 C), temperature source Oral, resp. rate 17, height 5\' 6"  (1.676 m), weight 284 lb 6 oz (128.992 kg), SpO2 96.00%. Nontender.  Incisions ok  Disposition: Final discharge disposition not confirmed  Discharge Orders   Future Appointments Provider Department Dept Phone   11/27/2013 11:40 AM Valarie MerinoMatthew Martinez Erikson Danzy, MD [facility] [place] [phone]   12/02/2013 3:30 PM [facility] Post-Op Class [facility] [phone]   Future Orders Complete By Expires   Discharge instructions  As directed    Comments:     Follow diet as prescribed Take pain meds that you have at home if needed.   Increase activity slowly  As directed    No wound care  As directed        Medication List         buPROPion 150 MG 24 hr tablet  Commonly known as:  WELLBUTRIN XL  Take 300 mg by mouth every morning.     busPIRone 15 MG tablet  Commonly known as:  BUSPAR  Take 30 mg by mouth 2 (two) times daily.     carvedilol 12.5 MG tablet  Commonly known as:  COREG  Take 12.5 mg by mouth 2 (two) times daily with a meal.     CELEBREX 200 MG capsule  Generic drug:  celecoxib  Take 200 mg by mouth 2 (two) times daily.     DULoxetine 60 MG capsule  Commonly known as:  CYMBALTA  Take 120 mg by mouth every morning.     furosemide 20 MG tablet  Commonly known  as:  LASIX  Take 20 mg by mouth daily as needed for fluid.     gabapentin 600 MG tablet  Commonly known as:  NEURONTIN  Take 1,800 mg by mouth at bedtime.     hydroxychloroquine 200 MG tablet  Commonly known as:  PLAQUENIL  Take 200 mg by mouth 2 (two) times daily.     latanoprost 0.005 % ophthalmic solution  Commonly known as:  XALATAN  Place 1 drop into both eyes at bedtime.     Oxcarbazepine 300 MG tablet  Commonly known as:  TRILEPTAL  Take 300 mg by mouth at bedtime.     oxycodone 5 MG capsule  Commonly known as:  OXY-IR  Take 5 mg by mouth every 4 (four) hours as needed for pain.     rOPINIRole 2 MG tablet  Commonly known as:  REQUIP  Take 2 mg by mouth at bedtime.     tiZANidine 4 MG tablet  Commonly known as:  ZANAFLEX  4 mg at bedtime.         SignedValarie Merino: Marie Martinez 11/19/2013, 12:10 PM

## 2013-11-27 ENCOUNTER — Ambulatory Visit (INDEPENDENT_AMBULATORY_CARE_PROVIDER_SITE_OTHER): Payer: Medicare PPO | Admitting: Surgery

## 2013-11-27 VITALS — BP 138/84 | HR 71 | Temp 97.1°F | Resp 16 | Wt 276.0 lb

## 2013-11-27 DIAGNOSIS — Z9884 Bariatric surgery status: Secondary | ICD-10-CM

## 2013-11-27 NOTE — Progress Notes (Signed)
Marie SailorsBetsy P Martinez 68 y.o.  Body mass index is 44.57 kg/(m^2).  Patient Active Problem List   Diagnosis Date Noted  . 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 Depression 11/13/2013  . Arthritis 11/13/2013  . Essential hypertension, benign 11/13/2013  . Morbid obesity 10/15/2013    Allergies  Allergen Reactions  . Eggs Or Egg-Derived Products Nausea And Vomiting  . Penicillins Other (See Comments)    Gets yeast infection  . Sulfur Nausea Only  . Zithromax [Azithromycin] Other (See Comments)    Not sure reaction    Past Surgical History  Procedure Laterality Date  . Abdominal hysterectomy  1990    bladder tack  . Heart ablation  feb 2012  . Bladder suspension  1980  . Toe surgery  late 1980's  . Cardiac catheterization  10-2010    normal  . Foot surgery Bilateral   . Joint replacement Bilateral yrs ago    both knees replaced  . Laparoscopic gastric sleeve resection N/A 11/17/2013    Procedure: LAPAROSCOPIC GASTRIC SLEEVE RESECTION;  Surgeon: Valarie MerinoMatthew B Tyrin Herbers, MD;  Location: [facility];  Service: General;  Laterality: N/A;   Laurence SpatesSPENCER, JAMES, MD No diagnosis found.  Doing very well.  Incisions look OK.  Will see back in 2 months.  Until then she knows to stay on diet and exercise.   Matt B. Daphine DeutscherMartin, MD, [facility]  [facility] [phone] beeper [phone]  11/27/2013 12:07 PM

## 2013-11-27 NOTE — Patient Instructions (Signed)
Add exercise as tolerated

## 2013-12-02 ENCOUNTER — Encounter: Payer: Medicare PPO | Attending: Surgery

## 2013-12-02 DIAGNOSIS — Z01818 Encounter for other preprocedural examination: Secondary | ICD-10-CM | POA: Insufficient documentation

## 2013-12-02 DIAGNOSIS — Z713 Dietary counseling and surveillance: Secondary | ICD-10-CM | POA: Diagnosis present

## 2013-12-02 NOTE — Progress Notes (Signed)
Bariatric Class:  Appt start time: 1530 end time:  1630.  2 Week Post-Operative Nutrition Class  Patient was seen on 12/02/2013 for Post[facility]ative [facility]   Surgery date: 11/17/2013 Surgery type: Gastric sleeve Start weight at [facility]: 290.5 lbs Weight today: 271.0 lbs Weight change:19.5 lbs Total weight loss: 19.5 lbs  TANITA  BODY COMP RESULTS  11/05/13 12/02/13   BMI (kg/m^2) 46.9 43.7   Fat Mass (lbs) 146.5 153.0   Fat Free Mass (lbs) 144 118.0   Total Body Water (lbs) 105.5 86.5    The following the learning objectives were met by the patient during this course:  Identifies Phase 3A (Soft, High Proteins) Dietary Goals and will begin from 2 weeks post-operatively to 2 months post-operatively  Identifies appropriate sources of fluids and proteins   States protein recommendations and appropriate sources post-operatively  Identifies the need for appropriate texture modifications, mastication, and bite sizes when consuming solids  Identifies appropriate multivitamin and calcium sources post-operatively  Describes the need for physical activity post-operatively and will follow MD recommendations  States when to call healthcare provider regarding medication questions or post-operative complications  Handouts given during class include:  Phase 3A: Soft, High Protein Diet Handout  Follow-Up Plan: Patient will follow-up at [facility] in 6 weeks for 8 week post-op nutrition visit for diet advancement per MD.

## 2013-12-02 NOTE — Patient Instructions (Signed)
Patient to follow Phase 3A-Soft, High Protein Diet and follow-up at [facility] in 6 weeks for 2 months post-op nutrition visit for diet advancement. 

## 2013-12-09 ENCOUNTER — Telehealth (INDEPENDENT_AMBULATORY_CARE_PROVIDER_SITE_OTHER): Payer: Self-pay

## 2013-12-09 NOTE — Telephone Encounter (Signed)
Patient states her hernia has gotten much worse and feels she needs to see Dr. Daphine DeutscherMartin. Appt 12-18-13 @ 10 am

## 2013-12-10 ENCOUNTER — Telehealth (HOSPITAL_COMMUNITY): Payer: Self-pay

## 2013-12-10 NOTE — Telephone Encounter (Signed)
Patient called in to discuss issues she is having.  Patient stated, I think I have the flu: Headache, vomiting, and general malaise.  I asked about fluid intake, patient states she is drinking approximately 1/2 pint per day.  Patient stated that her hernia is bothering her as evidenced by burning in chest going all the way up her throat.  Patient stated she was taking alka seltzer which was helping with the burning.    1.  Encouraged patient to drink more fluids due to increased risk of dehydration following bariatric surgery, reminded about the importance of calorie free, carbonation free and sugar free  -- patient says Grape Propel is the only liquid that doesn't burn  -- encouraged patient to increase fluid intake to a minimum of 64 oz per day, offered other options such as chicken broth, unsweetened decaf tea, etc. 2.  Reminded patient that Alka seltzer has carbonation and ASA, both of which are discouraged in the early post-operative period 3.  Encouraged patient to call surgeon's office, and describe symptoms  -- patient informed me that she had already done that and had secured an appointment for next Thursday.  12/18/13. 4.  Encouraged patient to call back if she was unable to keep fluids down for 24 hours or if her condition worsened.  Will continue to monitor.

## 2013-12-11 ENCOUNTER — Emergency Department (HOSPITAL_COMMUNITY)
Admission: EM | Admit: 2013-12-11 | Discharge: 2013-12-11 | Disposition: A | Discharge status: Home / Self Care | Payer: Medicare PPO | Attending: Emergency Medicine | Admitting: Emergency Medicine

## 2013-12-11 ENCOUNTER — Encounter (HOSPITAL_COMMUNITY): Payer: Self-pay | Admitting: Emergency Medicine

## 2013-12-11 DIAGNOSIS — I10 Essential (primary) hypertension: Secondary | ICD-10-CM | POA: Insufficient documentation

## 2013-12-11 DIAGNOSIS — Z791 Long term (current) use of non-steroidal anti-inflammatories (NSAID): Secondary | ICD-10-CM | POA: Insufficient documentation

## 2013-12-11 DIAGNOSIS — E860 Dehydration: Secondary | ICD-10-CM

## 2013-12-11 DIAGNOSIS — Z8719 Personal history of other diseases of the digestive system: Secondary | ICD-10-CM | POA: Insufficient documentation

## 2013-12-11 DIAGNOSIS — R112 Nausea with vomiting, unspecified: Secondary | ICD-10-CM | POA: Insufficient documentation

## 2013-12-11 DIAGNOSIS — H409 Unspecified glaucoma: Secondary | ICD-10-CM | POA: Insufficient documentation

## 2013-12-11 DIAGNOSIS — Z9889 Other specified postprocedural states: Secondary | ICD-10-CM | POA: Insufficient documentation

## 2013-12-11 DIAGNOSIS — F411 Generalized anxiety disorder: Secondary | ICD-10-CM | POA: Insufficient documentation

## 2013-12-11 DIAGNOSIS — R110 Nausea: Secondary | ICD-10-CM

## 2013-12-11 DIAGNOSIS — E669 Obesity, unspecified: Secondary | ICD-10-CM | POA: Insufficient documentation

## 2013-12-11 DIAGNOSIS — Z79899 Other long term (current) drug therapy: Secondary | ICD-10-CM | POA: Insufficient documentation

## 2013-12-11 DIAGNOSIS — R51 Headache: Secondary | ICD-10-CM | POA: Insufficient documentation

## 2013-12-11 DIAGNOSIS — Z90710 Acquired absence of both cervix and uterus: Secondary | ICD-10-CM | POA: Insufficient documentation

## 2013-12-11 DIAGNOSIS — I4891 Unspecified atrial fibrillation: Secondary | ICD-10-CM | POA: Insufficient documentation

## 2013-12-11 DIAGNOSIS — R519 Headache, unspecified: Secondary | ICD-10-CM

## 2013-12-11 DIAGNOSIS — R1013 Epigastric pain: Secondary | ICD-10-CM | POA: Insufficient documentation

## 2013-12-11 DIAGNOSIS — G2581 Restless legs syndrome: Secondary | ICD-10-CM | POA: Insufficient documentation

## 2013-12-11 DIAGNOSIS — I509 Heart failure, unspecified: Secondary | ICD-10-CM | POA: Insufficient documentation

## 2013-12-11 DIAGNOSIS — F329 Major depressive disorder, single episode, unspecified: Secondary | ICD-10-CM | POA: Insufficient documentation

## 2013-12-11 DIAGNOSIS — Z880 Allergy status to penicillin: Secondary | ICD-10-CM | POA: Insufficient documentation

## 2013-12-11 DIAGNOSIS — F3289 Other specified depressive episodes: Secondary | ICD-10-CM | POA: Insufficient documentation

## 2013-12-11 DIAGNOSIS — M1990 Unspecified osteoarthritis, unspecified site: Secondary | ICD-10-CM | POA: Insufficient documentation

## 2013-12-11 LAB — COMPREHENSIVE METABOLIC PANEL
ALT: 13 U/L (ref 0–35)
AST: 16 U/L (ref 0–37)
Albumin: 4 g/dL (ref 3.5–5.2)
Alkaline Phosphatase: 68 U/L (ref 39–117)
BUN: 11 mg/dL (ref 6–23)
CO2: 25 mEq/L (ref 19–32)
Calcium: 9.2 mg/dL (ref 8.4–10.5)
Chloride: 88 mEq/L — ABNORMAL LOW (ref 96–112)
Creatinine, Ser: 0.68 mg/dL (ref 0.50–1.10)
GFR calc Af Amer: >90 mL/min (ref >90)
GFR calc non Af Amer: 89 mL/min — ABNORMAL LOW (ref >90)
Glucose, Bld: 107 mg/dL — ABNORMAL HIGH (ref 70–99)
Potassium: 4.2 mEq/L (ref 3.7–5.3)
Sodium: 127 mEq/L — ABNORMAL LOW (ref 137–147)
Total Bilirubin: 0.5 mg/dL (ref 0.3–1.2)
Total Protein: 7.2 g/dL (ref 6.0–8.3)

## 2013-12-11 LAB — CBC WITH DIFFERENTIAL/PLATELET
BASOS ABS: 0 10*3/uL (ref 0.0–0.1)
BASOS PCT: 1 % (ref 0–1)
EOS PCT: 0 % (ref 0–5)
Eosinophils Absolute: 0 10*3/uL (ref 0.0–0.7)
HCT: 38.1 % (ref 36.0–46.0)
Hemoglobin: 13.1 g/dL (ref 12.0–15.0)
Lymphocytes Relative: 22 % (ref 12–46)
Lymphs Abs: 1.3 10*3/uL (ref 0.7–4.0)
MCH: 27.6 pg (ref 26.0–34.0)
MCHC: 34.4 g/dL (ref 30.0–36.0)
MCV: 80.4 fL (ref 78.0–100.0)
Monocytes Absolute: 0.5 10*3/uL (ref 0.1–1.0)
Monocytes Relative: 9 % (ref 3–12)
Neutro Abs: 4 10*3/uL (ref 1.7–7.7)
Neutrophils Relative %: 68 % (ref 43–77)
Platelets: 383 10*3/uL (ref 150–400)
RBC: 4.74 MIL/uL (ref 3.87–5.11)
RDW: 13.5 % (ref 11.5–15.5)
WBC: 5.9 10*3/uL (ref 4.0–10.5)

## 2013-12-11 LAB — URINE MICROSCOPIC-ADD ON

## 2013-12-11 LAB — URINALYSIS, ROUTINE W REFLEX MICROSCOPIC
Bilirubin Urine: NEGATIVE
Glucose, UA: NEGATIVE mg/dL
HGB URINE DIPSTICK: NEGATIVE
Ketones, ur: 15 mg/dL — AB
Nitrite: NEGATIVE
Protein, ur: NEGATIVE mg/dL
SPECIFIC GRAVITY, URINE: 1.01 (ref 1.005–1.030)
UROBILINOGEN UA: 0.2 mg/dL (ref 0.0–1.0)
pH: 7 (ref 5.0–8.0)

## 2013-12-11 LAB — I-STAT TROPONIN, ED: TROPONIN I, POC: 0.01 ng/mL (ref 0.00–0.08)

## 2013-12-11 LAB — LIPASE, BLOOD: Lipase: 40 U/L (ref 11–59)

## 2013-12-11 MED ORDER — ONDANSETRON HCL 4 MG PO TABS: 4.0000 mg | ORAL_TABLET | Freq: Four times a day (QID) | ORAL | Status: AC

## 2013-12-11 MED ORDER — MORPHINE SULFATE 4 MG/ML IJ SOLN
8.0000 mg | Freq: Once | INTRAMUSCULAR | Status: AC
  Administered 2013-12-11: 8 mg via INTRAVENOUS
  Filled 2013-12-11: qty 2

## 2013-12-11 MED ORDER — SODIUM CHLORIDE 0.9 % IV BOLUS (SEPSIS)
1000.0000 mL | Freq: Once | INTRAVENOUS | Status: AC
  Administered 2013-12-11: 1000 mL via INTRAVENOUS

## 2013-12-11 MED ORDER — PANTOPRAZOLE SODIUM 40 MG IV SOLR
40.0000 mg | Freq: Once | INTRAVENOUS | Status: AC
  Administered 2013-12-11: 40 mg via INTRAVENOUS
  Filled 2013-12-11: qty 40

## 2013-12-11 MED ORDER — GI COCKTAIL ~~LOC~~
30.0000 mL | Freq: Once | ORAL | Status: AC
  Administered 2013-12-11: 30 mL via ORAL
  Filled 2013-12-11: qty 30

## 2013-12-11 NOTE — ED Notes (Signed)
Pt presents with complaint of anterior headache, abdominal pain, and emesis. Pt reports having a burning sensation in her throat that extends to her stomach. Pt reports having a a gastric sleeve placed on Feb. 23, 2015. Pt also states "I just generally feel sick." Pt states she has been able to sip on grape propel. Pt is A/O x4 and vials are WDL.

## 2013-12-11 NOTE — ED Notes (Signed)
Pt. Is unable to use the restroom at this time, but is aware that we need a urine.  

## 2013-12-11 NOTE — ED Notes (Signed)
Pt. Is still unable to use the restroom but is still aware that we need a urine specimen.

## 2013-12-11 NOTE — ED Notes (Signed)
Pt. Is unable to use the restroom at this time, but is aware that we need urine specimen.  

## 2013-12-11 NOTE — ED Provider Notes (Signed)
Medical screening examination/treatment/procedure(s) were conducted as a shared visit with non-physician practitioner(s) or resident  and myself.  I personally evaluated the patient during the encounter and agree with the findings and plan unless otherwise indicated.    I have personally reviewed any xrays and/ or EKG's with the provider and I agree with interpretation.   Recurrent epig pain similar to hernia hx, mild, mild nausea, no bleeding, no fevers.  Exam obese, mild epig pain, soft/ no guarding.  Hx of gastric sleeve, pt has outpt fup with general surgery/ pcp.  No cp or sob.  Pt improved in ED.  Strict reasons to return, will hold on CT at this time.  IVF given.  HA, similar to previous, no meningismus, gradual onset, improved in ED with medicines.  Normal neuro exam. Fup outpt.  Headache, epigastric pain  Marie SkeensJoshua M Jacquelina Hewins, MD 12/11/13 [phone]

## 2013-12-11 NOTE — Discharge Instructions (Signed)
Take zofran for your nausea.  Follow up closely with your doctor for further care.  Return if you have worsening symptoms.    Dehydration, Elderly Dehydration is when you lose more fluids from the body than you take in. Vital organs such as the kidneys, brain, and heart cannot function without a proper amount of fluids and salt. Any loss of fluids from the body can cause dehydration.  Older adults are at a higher risk of dehydration than younger adults. As we age, our bodies are less able to conserve water and do not respond to temperature changes as well. Also, older adults do not become thirsty as easily or quickly. Because of this, older adults often do not realize they need to increase fluids to avoid dehydration.  CAUSES   Vomiting.  Diarrhea.  Excessive sweating.  Excessive urination.  Fever.  Certain medicines, such as blood pressure medicines called diuretics.  Poorly controlled blood sugars. SIGNS AND SYMPTOMS  Mild dehydration:  Thirst.  Dry lips.  Slightly dry mouth. Moderate dehydration:  Very dry mouth.  Sunken eyes.  Skin does not bounce back quickly when lightly pinched and released.  Dark urine and decreased urine production.  Decreased tear production.  Headache. Severe dehydration:  Very dry mouth.  Extreme thirst.  Rapid, weak pulse (more than 100 beats per minute at rest).  Cold hands and feet.  Not able to sweat in spite of heat.  Rapid breathing.  Blue lips.  Confusion and lethargy.  Difficulty being awakened.  Minimal urine production.  No tears. DIAGNOSIS  Your health care provider will diagnose dehydration based on your symptoms and your exam. Blood and urine tests will help confirm the diagnosis. The diagnostic evaluation should also identify the cause of dehydration. TREATMENT  Treatment of mild or moderate dehydration can often be done at home by increasing the amount of fluids that you drink. It is best to drink small  amounts of fluid more often. Drinking too much at one time can make vomiting worse. Severe dehydration needs to be treated at the hospital. You may be given IV fluids that contain water and electrolytes. HOME CARE INSTRUCTIONS   Ask your health care provider about specific rehydration instructions.  Drink enough fluids to keep your urine clear or pale yellow.  Drink small amounts frequently if you have nausea and vomiting.  Eat as you normally do.  Avoid:  Foods or drinks high in sugar.  Carbonated drinks.  Juice.  Extremely hot or cold fluids.  Drinks with caffeine.  Fatty, greasy foods.  Alcohol.  Tobacco.  Overeating.  Gelatin desserts.  Wash your hands well to avoid spreading bacteria and viruses.  Only take over-the-counter or prescription medicines for pain, discomfort, or fever as directed by your health care provider.  Ask your health care provider if you should continue all prescribed and over-the-counter medicines.  Keep all follow-up appointments with your health care provider. SEEK MEDICAL CARE IF:  You have abdominal pain, and it increases or stays in one area (localizes).  You have a rash, stiff neck, or severe headache.  You are irritable, sleepy, or difficult to awaken.  You are weak, dizzy, or extremely thirsty. SEEK IMMEDIATE MEDICAL CARE IF:   You are unable to keep fluids down, or you get worse despite treatment.  You have frequent episodes of vomiting or diarrhea.  You have blood or green matter (bile) in your vomit.  You have blood in your stool, or your stool looks black and tarry.  You have not urinated in 6 8 hours, or you have only urinated a small amount of very dark urine.  You have a fever.  You faint. MAKE SURE YOU:   Understand these instructions.  Will watch your condition.  Will get help right away if you are not doing well or get worse. Document Released: 12/02/2003 Document Revised: 07/02/2013 Document Reviewed:  05/19/2013 [facility] Patient Information 2014 [place], [place].

## 2013-12-11 NOTE — ED Provider Notes (Signed)
CSN: [id number]     Arrival date & time 12/11/13  1627 History   First MD Initiated Contact with Patient 12/11/13 1705     Chief Complaint  Patient presents with  . Emesis  . Abdominal Pain  . Headache     (Consider location/radiation/quality/duration/timing/severity/associated sxs/prior Treatment) HPI  68 year old obese female with history CHF, GERD, recent gastric sleeve placed on Feb 23rd, 2015 for weight control presents c/o abd pain.  Patient reports epigastric abdominal pain for the past 3 days. She described it as a burning sensation problem list that radiates up towards the esophagus. Symptoms seem similar to GERD she had in the past. Admits that she has history of hiatal hernia. She has been taking only a small amount of grape Propel and nutrient shakes, but states if she eat in any large amount it would make her sick. She feels dehydrated. Also endorsed and throbbing frontal headache for the past 2 days. She took Tylenol and her regular pain medication at home which provide some relief. She has ran out of her pain medication but sts she follow up with a pain clinic at [facility] and do not want to violate her pain contract.  Patient otherwise denies fever, chills, neck stiffness, but no sneezing coughing, chest pain, shortness of breath, back pain or dysuria, numbness or weakness. She reports the surgery went well.  Past Medical History  Diagnosis Date  . Arthritis   . Blood transfusion without reported diagnosis   . CHF (congestive heart failure)   . Hypertension   . Osteoarthritis   . Obesity   . Nonischemic cardiomyopathy   . Anxiety   . Depression   . H/O hiatal hernia   . Restless leg syndrome   . Vitamin D deficiency   . GERD (gastroesophageal reflux disease)   . Glaucoma     both eyes  . History of atrial fibrillation     had cardioversion feb 2012   Past Surgical History  Procedure Laterality Date  . Abdominal hysterectomy  1990    bladder tack  . Heart ablation   feb 2012  . Bladder suspension  1980  . Toe surgery  late 1980's  . Cardiac catheterization  10-2010    normal  . Foot surgery Bilateral   . Joint replacement Bilateral yrs ago    both knees replaced  . Laparoscopic gastric sleeve resection N/A 11/17/2013    Procedure: LAPAROSCOPIC GASTRIC SLEEVE RESECTION;  Surgeon: Valarie Merino, MD;  Location: [facility];  Service: General;  Laterality: N/A;   Family History  Problem Relation Age of Onset  . Cancer Mother     breast  . Dementia Mother   . Heart disease Father   . Cancer Maternal Aunt     breast  . Cancer Cousin     breast   History  Substance Use Topics  . Smoking status: Never Smoker   . Smokeless tobacco: Never Used  . Alcohol Use: Yes     Comment: wine socially   OB History   Grav Para Term Preterm Abortions TAB SAB Ect Mult Living                 Review of Systems  All other systems reviewed and are negative.      Allergies  Eggs or egg-derived products; Penicillins; Sulfur; and Zithromax  Home Medications   Current Outpatient Rx  Name  Route  Sig  Dispense  Refill  . buPROPion (WELLBUTRIN XL) 150 MG 24  hr tablet   Oral   Take 300 mg by mouth every morning.         . busPIRone (BUSPAR) 15 MG tablet   Oral   Take 30 mg by mouth 2 (two) times daily.          . carvedilol (COREG) 12.5 MG tablet   Oral   Take 12.5 mg by mouth 2 (two) times daily with a meal.          . CELEBREX 200 MG capsule   Oral   Take 200 mg by mouth 2 (two) times daily.          . DULoxetine (CYMBALTA) 60 MG capsule   Oral   Take 120 mg by mouth every morning.          . gabapentin (NEURONTIN) 600 MG tablet   Oral   Take 1,800 mg by mouth at bedtime.          . hydroxychloroquine (PLAQUENIL) 200 MG tablet   Oral   Take 200 mg by mouth 2 (two) times daily.          Marland Kitchen latanoprost (XALATAN) 0.005 % ophthalmic solution   Both Eyes   Place 1 drop into both eyes at bedtime.          . Oxcarbazepine  (TRILEPTAL) 300 MG tablet   Oral   Take 300 mg by mouth at bedtime.          Marland Kitchen oxycodone (OXY-IR) 5 MG capsule   Oral   Take 5 mg by mouth every 4 (four) hours as needed for pain.          Marland Kitchen rOPINIRole (REQUIP) 2 MG tablet   Oral   Take 2 mg by mouth at bedtime.          Marland Kitchen tiZANidine (ZANAFLEX) 4 MG tablet   Oral   Take 4 mg by mouth at bedtime.           BP 156/83  Pulse 90  Temp(Src) 98.5 F (36.9 C) (Oral)  Resp 22  SpO2 98% Physical Exam  Nursing note and vitals reviewed. Constitutional: She is oriented to person, place, and time. She appears well-developed and well-nourished. No distress.  Awake, alert, nontoxic appearance.  Morbidly obese  HENT:  Head: Atraumatic.  Mouth/Throat: Oropharynx is clear and moist.  Partial denture  Eyes: Conjunctivae are normal. Right eye exhibits no discharge. Left eye exhibits no discharge.  Neck: Neck supple.  No nuchal rigidity  Cardiovascular: Normal rate and regular rhythm.   Pulmonary/Chest: Effort normal. No respiratory distress. She exhibits no tenderness.  Abdominal: Soft. Bowel sounds are normal. There is tenderness (Very mild epigastric tenderness without guarding or rebound tenderness). There is no rebound.  Musculoskeletal: She exhibits no tenderness.  ROM appears intact, no obvious focal weakness  Neurological: She is alert and oriented to person, place, and time.  Mental status and motor strength appears intact  Skin: No rash noted.  Psychiatric: She has a normal mood and affect.    ED Course  Procedures (including critical care time)  5:44 PM Pt here with epigastric abd pain along with nausea and occasional non bloody/non bilious emesis.  She appears in NAD.  Has mild headache but no nuchal rigidity to suggest meningitis.  No CP/SOB concerning for ACS.  Will check trop and EKG.  Will give GI cocktail, protonix, and pain medication.  IVF given.    8:34 PM Pt felt much better.  Able to tolerates  PO.  If UA  normal, then will be stable for discharge with close f/u with PCP for further care.  Care discussed with Dr. Jodi MourningZavitz  Labs Review Labs Reviewed  COMPREHENSIVE METABOLIC PANEL - Abnormal; Notable for the following:    Sodium 127 (*)    Chloride 88 (*)    Glucose, Bld 107 (*)    GFR calc non Af Amer 89 (*)    All other components within normal limits  URINALYSIS, ROUTINE W REFLEX MICROSCOPIC - Abnormal; Notable for the following:    Ketones, ur 15 (*)    Leukocytes, UA TRACE (*)    All other components within normal limits  CBC WITH DIFFERENTIAL  LIPASE, BLOOD  URINE MICROSCOPIC-ADD ON  I-STAT TROPOININ, ED   Imaging Review No results found.   EKG Interpretation None      MDM   Final diagnoses:  Headache  Dehydration  Nausea    BP 138/75  Pulse 62  Temp(Src) 98 F (36.7 C) (Oral)  Resp 18  SpO2 98%  I have reviewed nursing notes and vital signs.  I reviewed available ER/hospitalization records thought the EMR     Fayrene HelperBowie Ryer As[place], [place]-C 12/11/13 2141

## 2013-12-18 ENCOUNTER — Ambulatory Visit (INDEPENDENT_AMBULATORY_CARE_PROVIDER_SITE_OTHER): Payer: Medicare PPO | Admitting: Surgery

## 2013-12-18 ENCOUNTER — Encounter (INDEPENDENT_AMBULATORY_CARE_PROVIDER_SITE_OTHER): Payer: Self-pay | Admitting: Surgery

## 2013-12-18 VITALS — BP 120/70 | HR 68 | Resp 14 | Ht 66.0 in | Wt 265.2 lb

## 2013-12-18 DIAGNOSIS — Z9884 Bariatric surgery status: Secondary | ICD-10-CM

## 2013-12-18 NOTE — Progress Notes (Signed)
Marie SailorsBetsy P Martinez 68 y.o.  Body mass index is 42.82 kg/(m^2).  Patient Active Problem List   Diagnosis Date Noted  . 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 Depression 11/13/2013  . Arthritis 11/13/2013  . Essential hypertension, benign 11/13/2013  . Morbid obesity 10/15/2013    Allergies  Allergen Reactions  . Eggs Or Egg-Derived Products Nausea And Vomiting  . Penicillins Other (See Comments)    Gets yeast infection  . Sulfur Nausea Only  . Zithromax [Azithromycin] Other (See Comments)    Not sure reaction    Past Surgical History  Procedure Laterality Date  . Abdominal hysterectomy  1990    bladder tack  . Heart ablation  feb 2012  . Bladder suspension  1980  . Toe surgery  late 1980's  . Cardiac catheterization  10-2010    normal  . Foot surgery Bilateral   . Joint replacement Bilateral yrs ago    both knees replaced  . Laparoscopic gastric sleeve resection N/A 11/17/2013    Procedure: LAPAROSCOPIC GASTRIC SLEEVE RESECTION;  Surgeon: Valarie MerinoMatthew B Hamilton Marinello, MD;  Location: [facility];  Service: General;  Laterality: N/A;   Laurence SpatesSPENCER, JAMES, MD No diagnosis found.  Had episode of "heartburn" last week after getting a bug.  This has resolved and patient able to drink OK.  Incisions ok.  Will try to get more of her meds as liquids. \ Return 6 weeks Matt B. Daphine DeutscherMartin, MD, [facility]  [facility] [phone] beeper [phone]  12/18/2013 11:10 AM

## 2013-12-18 NOTE — Patient Instructions (Signed)
Try to take more of your meds as liquids OK to use Prilosec OTC

## 2014-01-12 ENCOUNTER — Telehealth (INDEPENDENT_AMBULATORY_CARE_PROVIDER_SITE_OTHER): Payer: Self-pay

## 2014-01-12 NOTE — Telephone Encounter (Signed)
Pt is s/p gastric sleeve 10/2013 by Dr. Daphine DeutscherMartin.  Pt is constipated.  Instructed her to drink plenty of fluids, try taking Miralax bid along with a stool softener today and tomorrow.  If no relief, call us back.  Pt agreed with POC.

## 2014-01-14 ENCOUNTER — Encounter: Payer: Medicare PPO | Attending: Surgery | Admitting: Dietician

## 2014-01-14 DIAGNOSIS — Z01818 Encounter for other preprocedural examination: Secondary | ICD-10-CM | POA: Insufficient documentation

## 2014-01-14 DIAGNOSIS — Z713 Dietary counseling and surveillance: Secondary | ICD-10-CM | POA: Insufficient documentation

## 2014-01-14 NOTE — Progress Notes (Signed)
.   Follow-up visit:  8 Weeks Post-Operative Sleeve Surgery  Medical Nutrition Therapy:  Appt start time: 1030 end time:  1100.  Primary concerns today: Post-operative Bariatric Surgery Nutrition Management. Glennon MacBetsey states that she has "fallen off the wagon" with her diet in the past 10 days. Have 1-2 cookies per day.   Has been having a problem with constipation (no BM in 1 week) and took 3 Ex-Lax which caused some diarrhea and made her feel sick. Started taking Miralax a stool softener. Trying to find the right balance of medication to help medication.   Feels very tired if she goes walking.   Husband is in early stages of dementia and cares for grandchildren.   Surgery date: 11/17/2013 Surgery type: Gastric sleeve [facility] Weight today: 256.6 lbs  Weight change:14.5 lbs Total weight loss: 34  lbs  TANITA  BODY COMP RESULTS  11/05/13 12/02/13 01/14/14   BMI (kg/m^2) 46.9 43.7 41.4   Fat Mass (lbs) 146.5 153.0 137.5   Fat Free Mass (lbs) 144 118.0 119.0   Total Body Water (lbs) 105.5 86.5 87.0    Preferred Learning Style:   No preference indicated    Learning Readiness:   Ready  24-hr recall: B (AM): 2 Greek Yogurt oikos (24 g) Snk (AM): none  L (PM): 4 oz tuna with fat free dressing (28 g) Snk (PM): none D (PM):2 Greek yogurt oikos (24 g) or Premier protein (30g) or 4 oz tuna (28 g) Snk (PM): 1-2 cookies per day past 10 days  Fluid intake: 1 protein shake 11 oz and 3-4,  20 oz water bottles (60-80 oz total) Estimated total protein intake:75g - 80g  Medications: see list Supplementation: not taking  Using straws: No Drinking while eating: No  Hair loss: No  Carbonated beverages: No  N/V/D/C: Has been having a problem with constipation (no BM in 1 week) and took 3 Ex-Lax starting to Miralax Dumping syndrome: No  Recent physical activity:  Walks sometimes   Progress Towards Goal(s):  In progress.  Handouts given during visit  include:  Phase 3B High Protein + Non Starchy Vegetable   Nutritional Diagnosis:  [place]-3.3 Overweight/obesity related to past poor dietary habits and physical inactivity as evidenced by patient w/ recent Sleeve Gastrectomy surgery following dietary guidelines for continued weight loss.    Intervention:  Nutrition education/diet advancement.  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 Follow up in 1 months for 3 month post-op visit.

## 2014-01-14 NOTE — Patient Instructions (Signed)
Goals:  Follow Phase 3B: High Protein + Non-Starchy Vegetables  Eat 3-6 small meals/snacks, every 3-5 hrs  Increase lean protein foods to meet 60g goal  Increase fluid intake to 64oz +  Avoid drinking 15 minutes before, during and 30 minutes after eating  Aim for a goal >30 min of physical activity daily (start with 5-10 minutes)  Start taking multivitamins, B12, calcium again   Try having refried beans, cheese, and salsa  Limit yogurt to 2 per day

## 2014-01-30 ENCOUNTER — Ambulatory Visit (INDEPENDENT_AMBULATORY_CARE_PROVIDER_SITE_OTHER): Payer: Medicare PPO | Admitting: Surgery

## 2014-02-04 ENCOUNTER — Encounter (INDEPENDENT_AMBULATORY_CARE_PROVIDER_SITE_OTHER): Payer: Self-pay | Admitting: Surgery

## 2014-02-04 ENCOUNTER — Ambulatory Visit (INDEPENDENT_AMBULATORY_CARE_PROVIDER_SITE_OTHER): Payer: Medicare PPO | Admitting: Surgery

## 2014-02-04 VITALS — BP 130/84 | HR 66 | Temp 97.1°F | Ht 66.0 in | Wt 248.0 lb

## 2014-02-04 DIAGNOSIS — Z9884 Bariatric surgery status: Secondary | ICD-10-CM

## 2014-02-04 NOTE — Progress Notes (Signed)
[name] 68 y.o.  Body mass index is 40.05 kg/(m^2).  Patient Active Problem List   Diagnosis Date Noted  . 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 Depression 11/13/2013  . Arthritis 11/13/2013  . Essential hypertension, benign 11/13/2013  . Morbid obesity 10/15/2013    Allergies  Allergen Reactions  . Eggs Or Egg-Derived Products Nausea And Vomiting  . Penicillins Other (See Comments)    Gets yeast infection  . Sulfur Nausea Only  . Zithromax [Azithromycin] Other (See Comments)    Not sure reaction    Past Surgical History  Procedure Laterality Date  . Abdominal hysterectomy  1990    bladder tack  . Heart ablation  feb 2012  . Bladder suspension  1980  . Toe surgery  late 1980's  . Cardiac catheterization  10-2010    normal  . Foot surgery Bilateral   . Joint replacement Bilateral yrs ago    both knees replaced  . Laparoscopic gastric sleeve resection N/A 11/17/2013    Procedure: LAPAROSCOPIC GASTRIC SLEEVE RESECTION;  Surgeon: Valarie MerinoMatthew B Bradi Arbuthnot, MD;  Location: [facility];  Service: General;  Laterality: N/A;   Laurence SpatesSPENCER, JAMES, MD No diagnosis found.  Is down 37 lbs since her surgery on Feb 23.  For the most part is staying on her diet.  Not taking Buspar anymore.   Return 6 months.  Matt B. Daphine DeutscherMartin, MD, [facility]ACS  [facility] [phone] beeper [phone]  02/04/2014 9:25 AM

## 2014-02-04 NOTE — Patient Instructions (Signed)
Thanks for your patience.  If you need further assistance after leaving the office, please call our office and speak with a [facility] nurse.  [phone].  If you want to leave a message for Dr. Wayland Baik, please call his office phone at [phone]. 

## 2014-02-19 ENCOUNTER — Ambulatory Visit: Payer: Self-pay | Admitting: Dietician

## 2014-03-03 ENCOUNTER — Ambulatory Visit: Payer: Self-pay | Admitting: Dietician

## 2014-04-01 ENCOUNTER — Telehealth: Payer: Self-pay | Admitting: Dietician

## 2014-04-01 NOTE — Telephone Encounter (Signed)
Replied to an email to [facility] Center[facility] to remind her to get in adequate fluid (64 oz), take vitamins and calcium, aim for 60 g of protein, and work on exercise. Encouraged her to talk to doctor if she continues to feel bad.

## 2015-05-26 IMAGING — CR DG CHEST 2V
2 series · 2 of 2 positions shown · non-contrast
Comparison: None.

CLINICAL DATA: Preoperative for gastric sleeve procedure

EXAM:
CHEST  2 VIEW

[w chest pa]
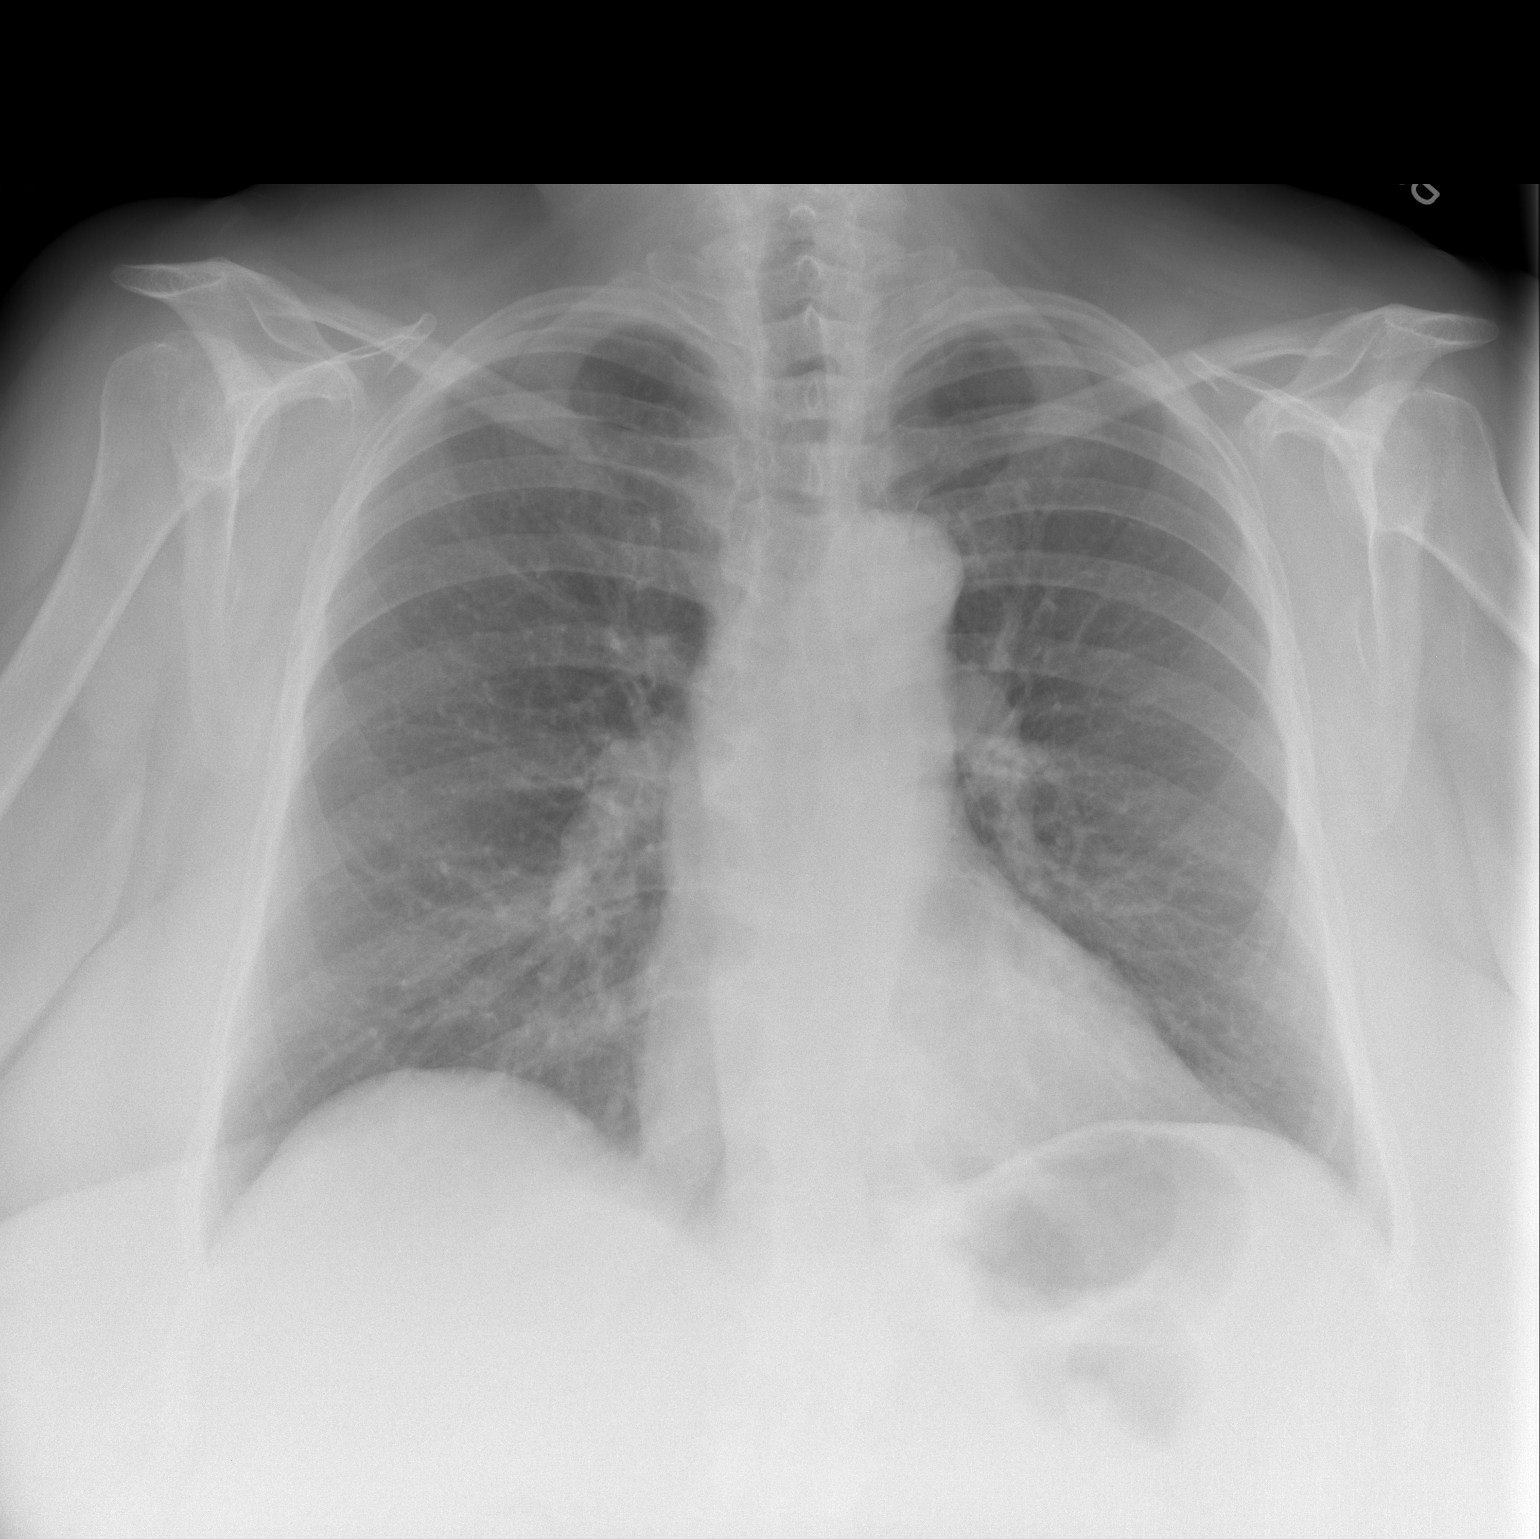

[w chest lat]
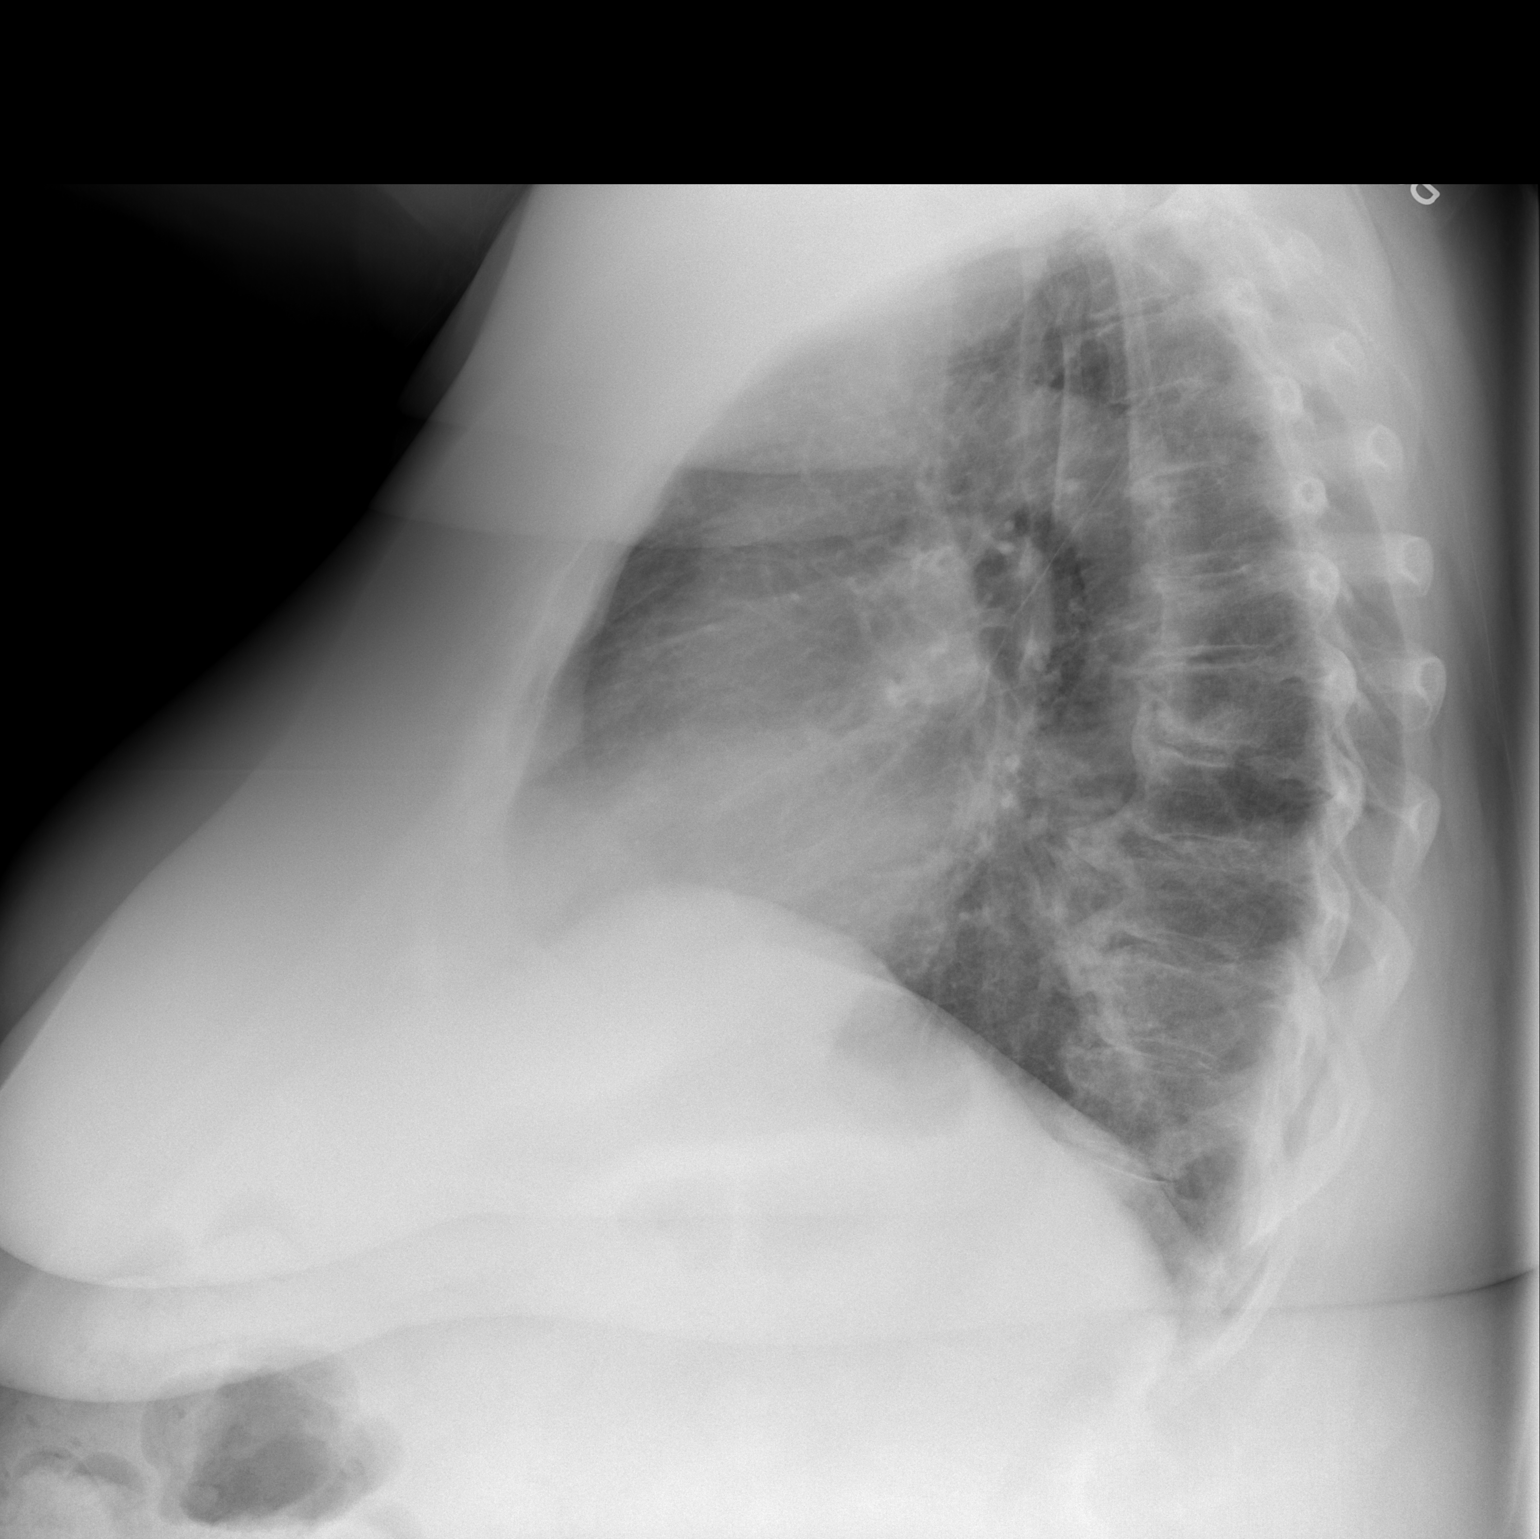

[2 of 2 positions shown; findings below may reference images not displayed]

FINDINGS: The lungs are well-expanded and clear. The cardiac silhouette is
normal in size. The cardiac apex is left-sided. There is mild
tortuosity of the descending thoracic aorta. There is no pleural
effusion. The gastric air bubble is on the left. There is
calcification of the anterior longitudinal ligament of the mid and
lower thoracic spine with mild degenerative disc change noted at
multiple levels.
IMPRESSION: There is no evidence of active cardiopulmonary disease.

## 2016-06-28 ENCOUNTER — Encounter (HOSPITAL_COMMUNITY): Payer: Self-pay

## 2017-06-18 ENCOUNTER — Encounter (HOSPITAL_COMMUNITY): Payer: Self-pay

## 2020-09-24 ENCOUNTER — Emergency Department (INDEPENDENT_AMBULATORY_CARE_PROVIDER_SITE_OTHER)
Admission: EM | Admit: 2020-09-24 | Discharge: 2020-09-24 | Disposition: A | Discharge status: Home / Self Care | Payer: Medicare PPO | Source: Home / Self Care

## 2020-09-24 ENCOUNTER — Encounter: Payer: Self-pay | Admitting: Emergency Medicine

## 2020-09-24 ENCOUNTER — Other Ambulatory Visit: Payer: Self-pay

## 2020-09-24 DIAGNOSIS — S301XXA Contusion of abdominal wall, initial encounter: Secondary | ICD-10-CM

## 2020-09-24 DIAGNOSIS — R1030 Lower abdominal pain, unspecified: Secondary | ICD-10-CM

## 2020-09-24 LAB — POCT CBC W AUTO DIFF (K'VILLE URGENT CARE)

## 2020-09-24 LAB — POCT URINALYSIS DIP (MANUAL ENTRY)
Bilirubin, UA: NEGATIVE
Blood, UA: NEGATIVE
Glucose, UA: NEGATIVE mg/dL
Ketones, POC UA: NEGATIVE mg/dL
Leukocytes, UA: NEGATIVE
Nitrite, UA: NEGATIVE
Protein Ur, POC: NEGATIVE mg/dL
Spec Grav, UA: 1.015 (ref 1.010–1.025)
Urobilinogen, UA: 0.2 E.U./dL
pH, UA: 7 (ref 5.0–8.0)

## 2020-09-24 NOTE — ED Triage Notes (Signed)
Patient here stating has felt she had UTI for past 2 weeks; twice her PCP called in rxs that do not seem to have worked. Has had covid and influenza vaccinations.

## 2020-09-24 NOTE — ED Provider Notes (Signed)
[facility]    CSN: [id number] Arrival date & time: 09/24/20  1614      History   Chief Complaint Chief Complaint  Patient presents with  . Dysuria    HPI Marie Martinez is a 74 y.o. female.   HPI  Marie Martinez is a 73 y.o. female presenting to UC with c/o lower abdominal pressure/discomfort for about 2 weeks. Mild burning with urination. States she called her PCP who called in 2 different antibiotics, macrobid and then cefdinir but the sensation has persisted. About 1 week ago she noticed some bruising cross her abdomen. Denies trauma. Denies fever, chills, n/v/d.  No blood in her stool or black stool. No other bruising or bleeding. She is on aspirin but no other blood thinners.   Pt notes she is suppose to have surgery on her foot in about 1 week and hopes to be well enough to still have the surgery.   Past Medical History:  Diagnosis Date  . Anxiety   . Arthritis   . Blood transfusion without reported diagnosis   . CHF (congestive heart failure) (HCC)   . Depression   . GERD (gastroesophageal reflux disease)   . Glaucoma    both eyes  . H/O hiatal hernia   . History of atrial fibrillation    had cardioversion feb 2012  . Hypertension   . Nonischemic cardiomyopathy (HCC)   . Obesity   . Osteoarthritis   . Restless leg syndrome   . Vitamin D deficiency     Patient Active Problem List   Diagnosis Date Noted  . 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 Depression 11/13/2013  . Arthritis 11/13/2013  . Essential hypertension, benign 11/13/2013  . Morbid obesity (HCC) 10/15/2013    Past Surgical History:  Procedure Laterality Date  . ABDOMINAL HYSTERECTOMY  1990   bladder tack  . BLADDER SUSPENSION  1980  . CARDIAC CATHETERIZATION  10-2010   normal  . FOOT SURGERY Bilateral   . heart ablation  feb 2012  . JOINT REPLACEMENT Bilateral yrs ago   both knees replaced  . LAPAROSCOPIC GASTRIC SLEEVE RESECTION N/A 11/17/2013   Procedure:  LAPAROSCOPIC GASTRIC SLEEVE RESECTION;  Surgeon: Valarie Merino, MD;  Location: [facility];  Service: General;  Laterality: N/A;  . TOE SURGERY  late 1980's    OB History   No obstetric history on file.      Home Medications    Prior to Admission medications   Medication Sig Start Date End Date Taking? Authorizing Provider  buPROPion (WELLBUTRIN XL) 150 MG 24 hr tablet Take 300 mg by mouth every morning.    [provider]  busPIRone (BUSPAR) 15 MG tablet Take 30 mg by mouth 2 (two) times daily.  12/11/12   [provider]  carvedilol (COREG) 12.5 MG tablet Take 12.5 mg by mouth 2 (two) times daily with a meal.  10/30/12   [provider]  CELEBREX 200 MG capsule Take 200 mg by mouth 2 (two) times daily.  12/30/12   [provider]  DULoxetine (CYMBALTA) 60 MG capsule Take 120 mg by mouth every morning.  12/27/12   [provider]  gabapentin (NEURONTIN) 600 MG tablet Take 1,800 mg by mouth at bedtime.  01/08/13   [provider]  hydroxychloroquine (PLAQUENIL) 200 MG tablet Take 200 mg by mouth 2 (two) times daily.  12/30/12   [provider]  latanoprost (XALATAN) 0.005 % ophthalmic solution Place 1 drop  into both eyes at bedtime.  01/15/13   [provider]  omeprazole (PRILOSEC) 20 MG capsule  01/20/14   [provider]  ondansetron (ZOFRAN) 4 MG tablet Take 1 tablet (4 mg total) by mouth every 6 (six) hours. 12/11/13   Fayrene Helper, PA-C  Oxcarbazepine (TRILEPTAL) 300 MG tablet Take 300 mg by mouth at bedtime.  10/29/12   [provider]  oxycodone (OXY-IR) 5 MG capsule Take 5 mg by mouth every 4 (four) hours as needed for pain.     [provider]  rOPINIRole (REQUIP) 2 MG tablet Take 2 mg by mouth at bedtime.  11/11/12   [provider]  tiZANidine (ZANAFLEX) 4 MG tablet Take 4 mg by mouth at bedtime.  11/10/13   [provider]    Family History Family History  Problem Relation Age  of Onset  . Cancer Mother        breast  . Dementia Mother   . Heart disease Father   . Cancer Maternal Aunt        breast  . Cancer Cousin        breast    Social History Social History   Tobacco Use  . Smoking status: Never Smoker  . Smokeless tobacco: Never Used  Substance Use Topics  . Alcohol use: Yes    Comment: wine socially  . Drug use: No     Allergies   Eggs or egg-derived products, Penicillins, Sulfur, and Zithromax [azithromycin]   Review of Systems Review of Systems  Constitutional: Negative for appetite change, chills, diaphoresis, fatigue, fever and unexpected weight change.  Gastrointestinal: Positive for abdominal pain (lower). Negative for blood in stool, constipation, diarrhea, nausea and vomiting.  Genitourinary: Positive for dysuria and pelvic pain (over bladder). Negative for decreased urine volume, flank pain, frequency, hematuria, urgency, vaginal bleeding, vaginal discharge and vaginal pain.  Musculoskeletal: Positive for back pain (mild lower bilateral).  Skin: Positive for color change. Negative for rash and wound.  Neurological: Negative for dizziness, light-headedness and headaches.     Physical Exam Triage Vital Signs ED Triage Vitals  Enc Vitals Group     BP 09/24/20 1755 137/86     Pulse Rate 09/24/20 1755 64     Resp 09/24/20 1755 (!) 6     Temp 09/24/20 1755 98.7 F (37.1 C)     Temp Source 09/24/20 1755 Oral     SpO2 09/24/20 1755 98 %     Weight 09/24/20 1756 233 lb (105.7 kg)     Height 09/24/20 1756 5\' 6"  (1.676 m)     Head Circumference --      Peak Flow --      Pain Score 09/24/20 1755 6     Pain Loc --      Pain Edu? --      Excl. in GC? --    No data found.  Updated Vital Signs BP 137/86 (BP Location: Right Arm)   Pulse 64   Temp 98.7 F (37.1 C) (Oral)   Resp 16   Ht 5\' 6"  (1.676 m)   Wt 233 lb (105.7 kg)   SpO2 98%   BMI 37.61 kg/m   Visual Acuity Right Eye Distance:   Left Eye Distance:    Bilateral Distance:    Right Eye Near:   Left Eye Near:    Bilateral Near:     Physical Exam Vitals and nursing note reviewed.  Constitutional:      General: She  is not in acute distress.    Appearance: Normal appearance. She is well-developed and well-nourished. She is obese. She is not ill-appearing, toxic-appearing or diaphoretic.  HENT:     Head: Normocephalic and atraumatic.  Eyes:     Extraocular Movements: EOM normal.  Cardiovascular:     Rate and Rhythm: Normal rate and regular rhythm.  Pulmonary:     Effort: Pulmonary effort is normal. No respiratory distress.     Breath sounds: Normal breath sounds. No stridor. No wheezing, rhonchi or rales.  Abdominal:     Palpations: Abdomen is soft. There is no mass.     Tenderness: There is abdominal tenderness. There is no right CVA tenderness, left CVA tenderness, guarding or rebound.     Hernia: No hernia is present.       Comments: Obese abdomen, mild suprapubic tenderness under panus.    Musculoskeletal:        General: Normal range of motion.     Cervical back: Normal range of motion.  Skin:    General: Skin is warm and dry.  Neurological:     Mental Status: She is alert and oriented to person, place, and time.  Psychiatric:        Mood and Affect: Mood and affect normal.        Behavior: Behavior normal.      UC Treatments / Results  Labs (all labs ordered are listed, but only abnormal results are displayed) Labs Reviewed  COMPLETE METABOLIC PANEL WITH GFR  POCT URINALYSIS DIP (MANUAL ENTRY)  POCT CBC W AUTO DIFF ([facility])    EKG   Radiology No results found.  Procedures Procedures (including critical care time)  Medications Ordered in UC Medications - No data to display  Initial Impression / Assessment and Plan / UC Course  I have reviewed the triage vital signs and the nursing notes.  Pertinent labs & imaging results that were available during my care of the patient were reviewed  by me and considered in my medical decision making (see chart for details).     UA: normal CBC: unremarkable CMP: pending Discussed pt with Dr. Varney Baas due to bruising noted around mid abdomen/umbilicus. Pt to f/u with PCP Discussed symptoms that warrant emergent care in the ED. Per verbalized understanding and agreement with tx plan.  AVS given  Final Clinical Impressions(s) / UC Diagnoses   Final diagnoses:  Lower abdominal pain  Contusion of abdominal wall, initial encounter     Discharge Instructions      Call Monday to schedule an in-person exam with your primary care provider for further evaluation and treatment of your symptoms.   Call 911 or have someone drive you to the hospital if symptoms significantly worsening including worsening abdominal pain, fever, vomiting, or other new concerning symptoms develop.      ED Prescriptions    None     PDMP not reviewed this encounter.   Lurene Shadow, [place] 09/24/20 1953

## 2020-09-24 NOTE — Discharge Instructions (Signed)
°  Call Monday to schedule an in-person exam with your primary care provider for further evaluation and treatment of your symptoms.   Call 911 or have someone drive you to the hospital if symptoms significantly worsening including worsening abdominal pain, fever, vomiting, or other new concerning symptoms develop.

## 2020-09-27 LAB — COMPLETE METABOLIC PANEL WITH GFR
AG Ratio: 1.6 (calc) (ref 1.0–2.5)
ALT: 8 U/L (ref 6–29)
AST: 14 U/L (ref 10–35)
Albumin: 4.1 g/dL (ref 3.6–5.1)
Alkaline phosphatase (APISO): 88 U/L (ref 37–153)
BUN: 10 mg/dL (ref 7–25)
CO2: 28 mmol/L (ref 20–32)
Calcium: 9.1 mg/dL (ref 8.6–10.4)
Chloride: 101 mmol/L (ref 98–110)
Creat: 0.78 mg/dL (ref 0.60–0.93)
GFR, Est African American: 87 mL/min/{1.73_m2} (ref >=60)
GFR, Est Non African American: 75 mL/min/{1.73_m2} (ref >=60)
Globulin: 2.6 g/dL (calc) (ref 1.9–3.7)
Glucose, Bld: 96 mg/dL (ref 65–99)
Potassium: 5.1 mmol/L (ref 3.5–5.3)
Sodium: 138 mmol/L (ref 135–146)
Total Bilirubin: 0.2 mg/dL (ref 0.2–1.2)
Total Protein: 6.7 g/dL (ref 6.1–8.1)

## 2023-05-27 DEATH — deceased
